# Patient Record
Sex: Female | Born: 1960
Health system: Southern US, Community
[De-identification: ages and names within clinical notes are randomized; demographics above are authoritative.]

## PROBLEM LIST (undated history)

## (undated) DIAGNOSIS — M069 Rheumatoid arthritis, unspecified: Secondary | ICD-10-CM

## (undated) DIAGNOSIS — N200 Calculus of kidney: Secondary | ICD-10-CM

## (undated) DIAGNOSIS — G5600 Carpal tunnel syndrome, unspecified upper limb: Secondary | ICD-10-CM

## (undated) HISTORY — DX: Carpal tunnel syndrome, unspecified upper limb: G56.00

## (undated) HISTORY — PX: DILATION AND CURETTAGE OF UTERUS: SHX78

## (undated) HISTORY — PX: HERNIA REPAIR: SHX51

## (undated) HISTORY — DX: Rheumatoid arthritis, unspecified: M06.9

## (undated) HISTORY — PX: UTERINE SEPTUM RESECTION: SHX5386

## (undated) HISTORY — DX: Calculus of kidney: N20.0

---

## 1997-06-23 ENCOUNTER — Other Ambulatory Visit: Admission: RE | Admit: 1997-06-23 | Discharge: 1997-06-23 | Payer: Self-pay | Admitting: Gynecology

## 1998-09-11 ENCOUNTER — Other Ambulatory Visit: Admission: RE | Admit: 1998-09-11 | Discharge: 1998-09-11 | Payer: Self-pay | Admitting: Gynecology

## 1999-07-21 ENCOUNTER — Ambulatory Visit (HOSPITAL_COMMUNITY): Admission: RE | Admit: 1999-07-21 | Discharge: 1999-07-21 | Payer: Self-pay | Admitting: Obstetrics and Gynecology

## 1999-07-21 ENCOUNTER — Encounter: Payer: Self-pay | Admitting: Obstetrics and Gynecology

## 1999-12-14 ENCOUNTER — Inpatient Hospital Stay (HOSPITAL_COMMUNITY): Admission: AD | Admit: 1999-12-14 | Discharge: 1999-12-14 | Payer: Self-pay | Admitting: Obstetrics and Gynecology

## 1999-12-25 ENCOUNTER — Inpatient Hospital Stay (HOSPITAL_COMMUNITY): Admission: AD | Admit: 1999-12-25 | Discharge: 1999-12-28 | Payer: Self-pay | Admitting: Obstetrics and Gynecology

## 2000-01-29 ENCOUNTER — Other Ambulatory Visit: Admission: RE | Admit: 2000-01-29 | Discharge: 2000-01-29 | Payer: Self-pay | Admitting: Obstetrics and Gynecology

## 2001-01-25 ENCOUNTER — Other Ambulatory Visit: Admission: RE | Admit: 2001-01-25 | Discharge: 2001-01-25 | Payer: Self-pay | Admitting: Gynecology

## 2002-01-08 ENCOUNTER — Other Ambulatory Visit: Admission: RE | Admit: 2002-01-08 | Discharge: 2002-01-08 | Payer: Self-pay | Admitting: Gynecology

## 2004-02-14 ENCOUNTER — Other Ambulatory Visit: Admission: RE | Admit: 2004-02-14 | Discharge: 2004-02-14 | Payer: Self-pay | Admitting: Gynecology

## 2008-05-02 ENCOUNTER — Other Ambulatory Visit: Admission: RE | Admit: 2008-05-02 | Discharge: 2008-05-02 | Payer: Self-pay | Admitting: Gynecology

## 2009-04-10 ENCOUNTER — Encounter: Admission: RE | Admit: 2009-04-10 | Discharge: 2009-04-10 | Payer: Self-pay | Admitting: Family Medicine

## 2010-09-18 NOTE — Discharge Summary (Signed)
[facility]  Patient:    Amber Sampson, Amber Sampson                      MRN: [id number] Adm. Date:  [id number] Disc. Date: [id number] Attending:  Osborn Coho Dictator:   Leilani Able, P.A.                           Discharge Summary  FINAL DIAGNOSES:              1. Spontaneous vaginal delivery of female infant                                  with Apgars of 5 and 8.                               2. Postpartum temperatures.  HISTORY OF PRESENT ILLNESS:   This 50 year old G10, P2-0-7-2 presents at 41-1/7 weeks in active labor.  The patients cervix was complete and complete at this time.  She did have thick, meconium stained fluid.  The patients prenatal course has been complicated advanced maternal age.  She did have a normal AFP, but declined amniocentesis.  The patient has a history of preterm labor that was treated with Terbutaline, as well.  The patient also has a history of positive group B Strep.  The patient was admitted at this time.  HOSPITAL COURSE:              Upon Dr. Linwood Dibbles arrival, the patient had been pushing for about 10 minutes.  She was noted to have thick, meconium stained fluid and variable decelerations.  She had episiotomy with delivery of a 9 pound, 9 ounce female infant with Apgars of 5 and 8.  There was a tight nuchal cord x 1 that was cleanly cut.  There was mild shoulder dystocia treated with McRoberts maneuver.  The baby was handed to the NICU.  The patient did have a postpartum hemorrhage that was treated with Pitocin, Hemabate and massage.  The mother did not get IV antibiotics prior to delivery secondary to rapid labor.  The patients postpartum course was complicated by some anemia and some low grade temperatures.  Her abdomen was soft and nontender and her bleeding had subsided.  On postpartum day #2, the patients temperature was about 100.2.  Some family members had been sick with viral syndrome and GI symptoms.   The patient was kept one more day to follow the temperatures.  On postpartum day #3, the patient still had a temperature of about 100.4.  She was complaining of cough with sputum production of about one week.  Her lungs were examined and noted to have some wheezes and rales, left greater than right.  Her abdomen was still soft and nontender.  The low grade temperatures were thought to probably be secondary to a respiratory infection.  The patient was felt ready for discharge.  DISPOSITION:                  The patient was sent home on a regular diet an told to decrease activities.  She was told to continue prenatal vitamins and FeSO4.  She was given Tylox, 1-2 q.4h. p.r.n. pain.  She was also sent home with Ceftin 250 mg, one b.i.d. x 7 days.  She was told to call for any temperatures above 101. DD:  01/18/00 TD:  01/19/00 Job: [id number]

## 2010-12-07 IMAGING — US US ABDOMEN COMPLETE
1 series · 14 of 25 positions shown · non-contrast
Comparison: None.

CLINICAL DATA: Possible ventral hernia.

COMPLETE ABDOMINAL ULTRASOUND

[Series 1: us abdomen complete · 14 of 93 slices shown]
[im 1/93]
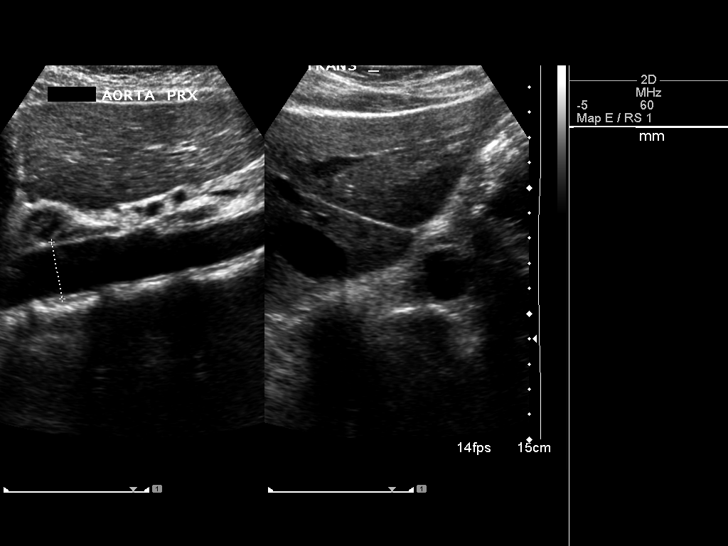
[im 8/93]
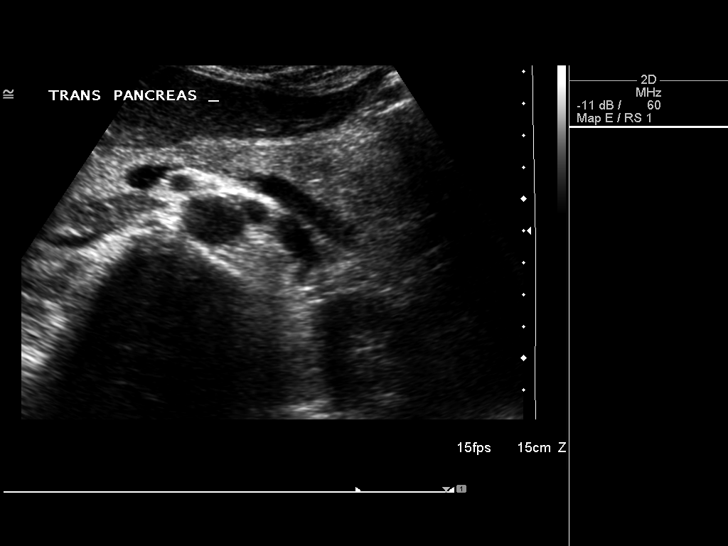
[im 16/93]
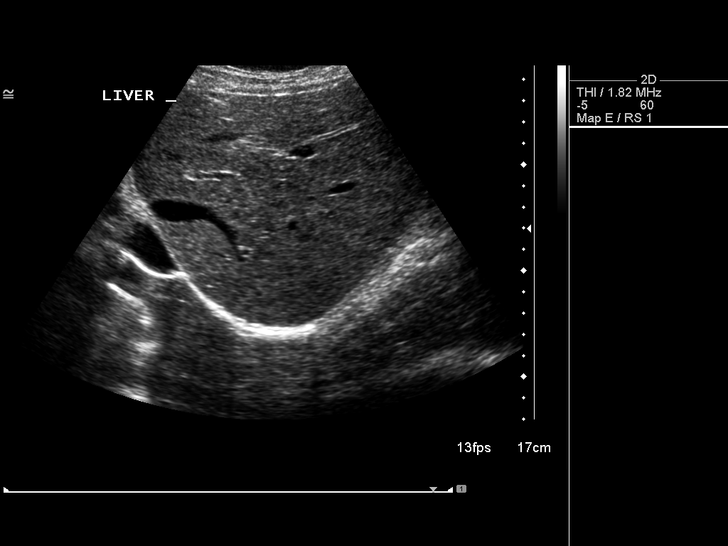
[im 24/93]
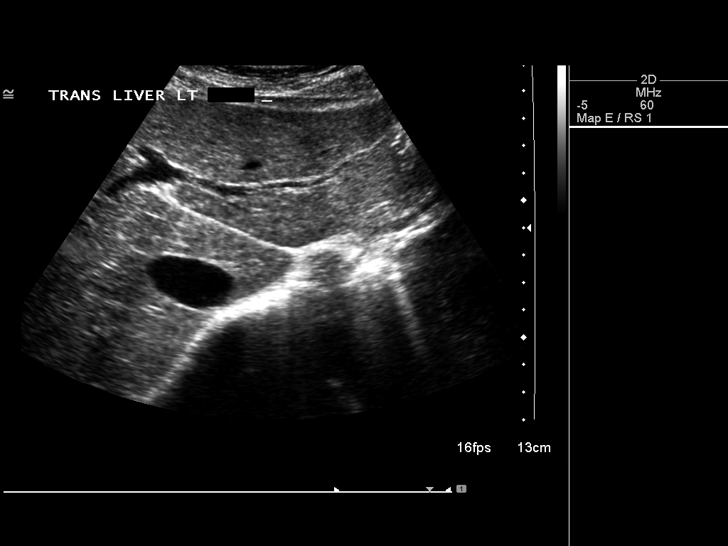
[im 31/93]
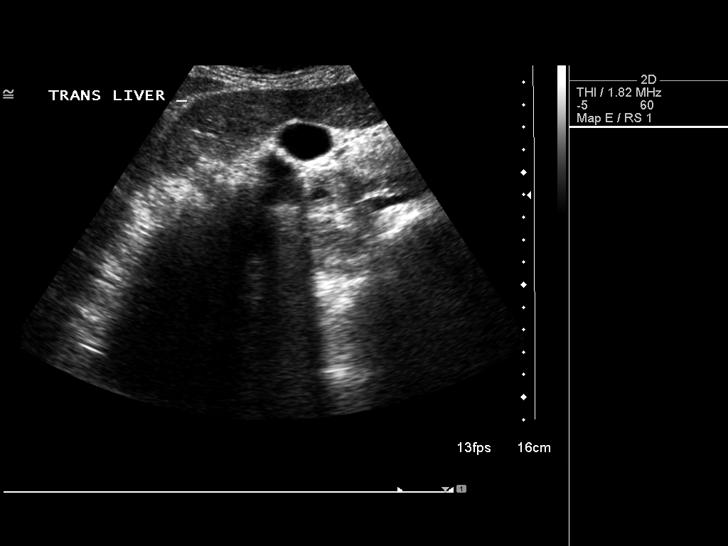
[im 35/93]
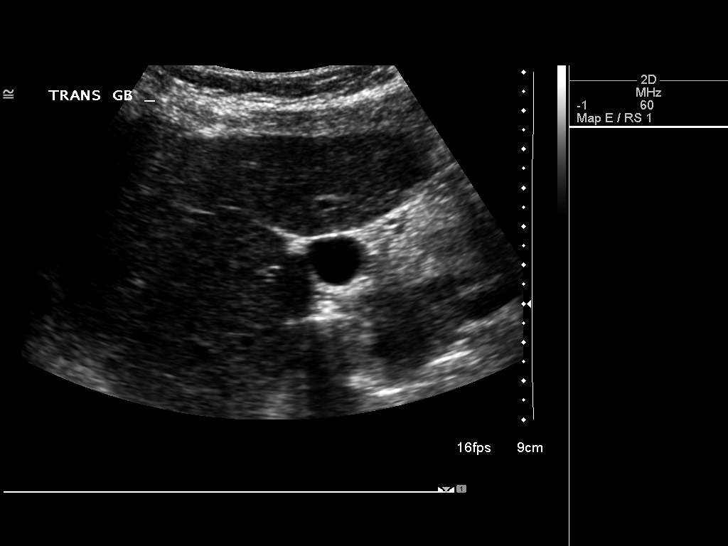
[im 43/93]
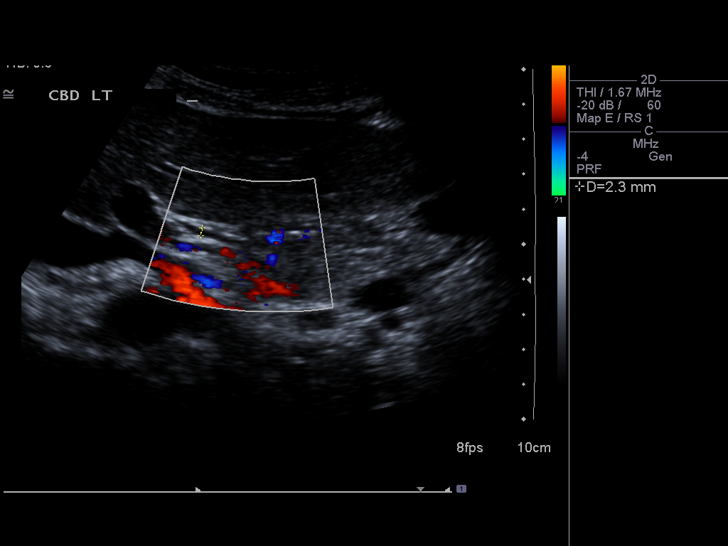
[im 50/93]
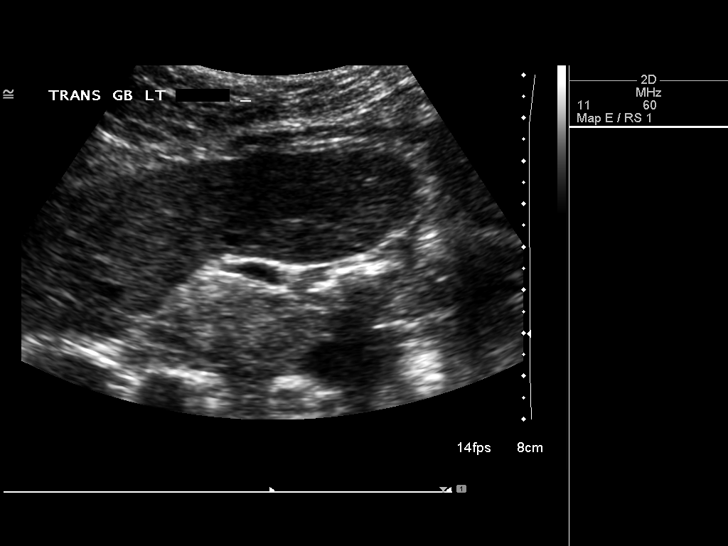
[im 58/93]
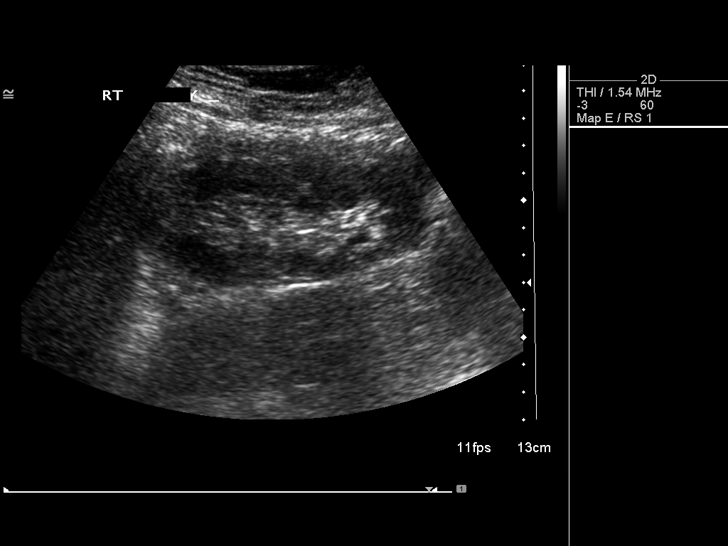
[im 62/93]
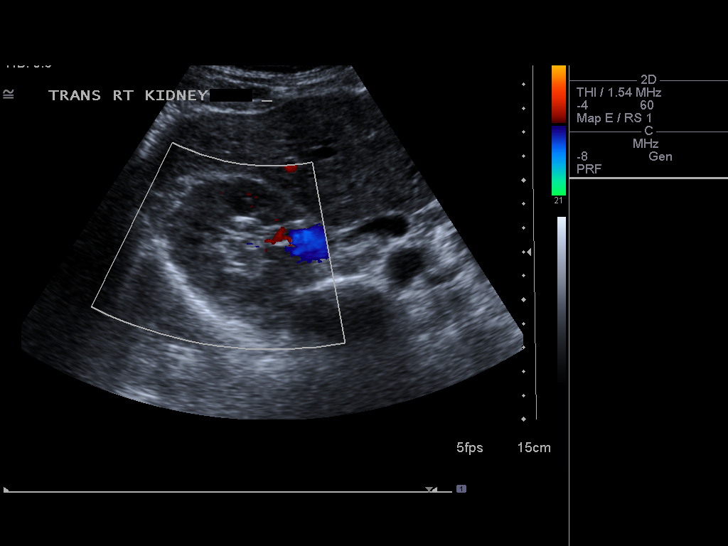
[im 70/93]
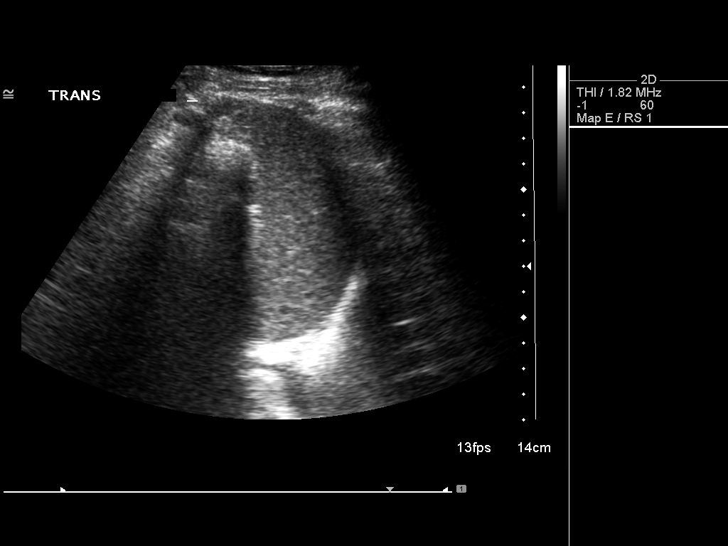
[im 77/93]
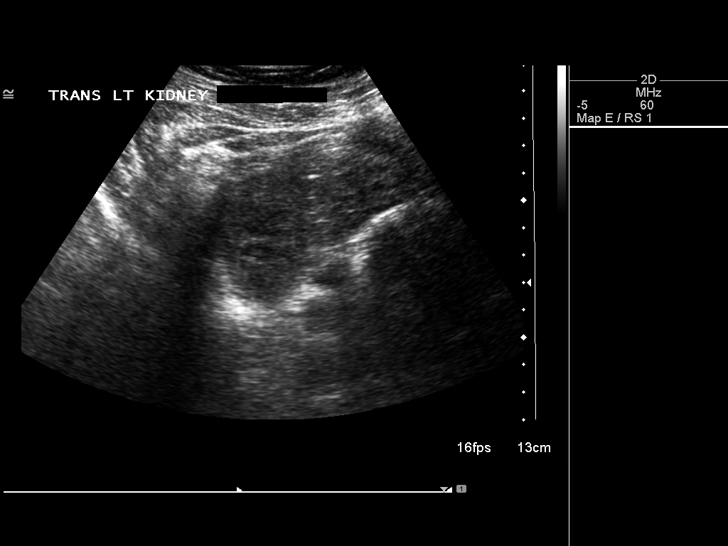
[im 85/93]
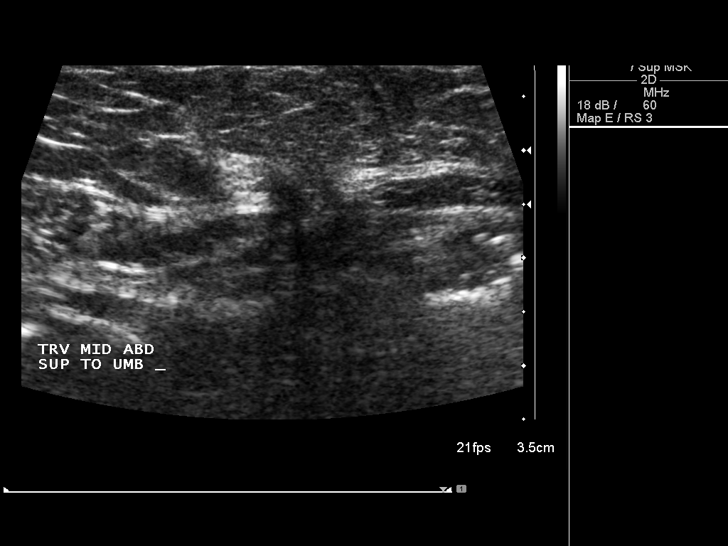
[im 93/93]
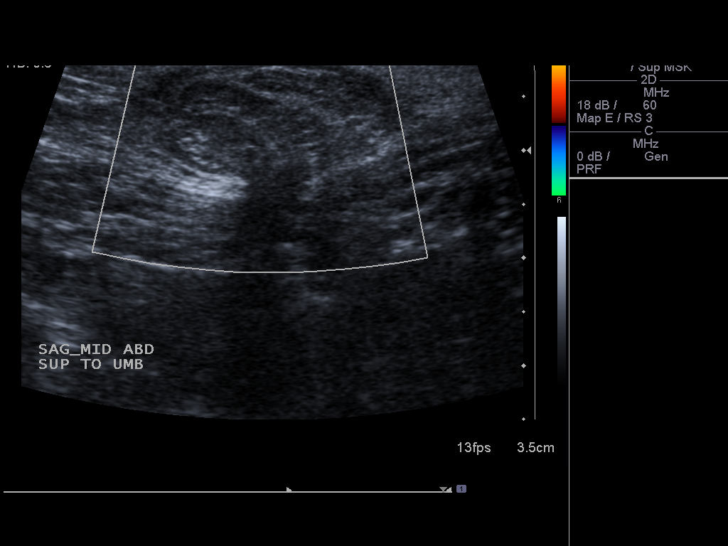

[14 of 25 positions shown; findings below may reference images not displayed]

FINDINGS: Gallbladder:  No gallstones, gallbladder wall thickening, or
pericholecystic fluid.

Common bile duct:  2.3 mm diameter, unremarkable

Liver:  No focal lesion identified.  Within normal limits in
parenchymal echogenicity. Portal vein dilated to 14 mm diameter.

IVC:  Appears normal.

Pancreas:  No focal abnormality seen.

Spleen:  5.9 cm in length, unremarkable

Right Kidney:    10.2 cm in length, without focal lesion or
hydronephrosis

Left Kidney:  10.8 cm in length, without focal lesion or
hydronephrosis

Abdominal aorta:  No aneurysm identified.

There is a small ventral hernia corresponding to the   palpable
region which contains mesenteric fat measuring approximately 12 x
23 mm superior to the umbilicus. No evidence of bowel, fluid, or
peristalsis within the lesion.
IMPRESSION: 1.  2.3 cm ventral hernia which appears to contain only mesenteric
fat.
2.  Otherwise negative   abdominal ultrasound.

## 2016-05-03 HISTORY — PX: CYSTOSCOPY: SUR368

## 2016-07-26 DIAGNOSIS — J029 Acute pharyngitis, unspecified: Secondary | ICD-10-CM | POA: Diagnosis not present

## 2016-07-26 DIAGNOSIS — R809 Proteinuria, unspecified: Secondary | ICD-10-CM | POA: Diagnosis not present

## 2016-07-26 DIAGNOSIS — R310 Gross hematuria: Secondary | ICD-10-CM | POA: Diagnosis not present

## 2016-07-26 DIAGNOSIS — R824 Acetonuria: Secondary | ICD-10-CM | POA: Diagnosis not present

## 2016-07-30 DIAGNOSIS — R310 Gross hematuria: Secondary | ICD-10-CM | POA: Diagnosis not present

## 2016-07-30 DIAGNOSIS — R1032 Left lower quadrant pain: Secondary | ICD-10-CM | POA: Diagnosis not present

## 2016-07-30 DIAGNOSIS — R002 Palpitations: Secondary | ICD-10-CM | POA: Diagnosis not present

## 2016-08-23 DIAGNOSIS — R310 Gross hematuria: Secondary | ICD-10-CM | POA: Diagnosis not present

## 2016-08-23 DIAGNOSIS — R350 Frequency of micturition: Secondary | ICD-10-CM | POA: Diagnosis not present

## 2016-08-31 DIAGNOSIS — R310 Gross hematuria: Secondary | ICD-10-CM | POA: Diagnosis not present

## 2016-08-31 DIAGNOSIS — R3914 Feeling of incomplete bladder emptying: Secondary | ICD-10-CM | POA: Diagnosis not present

## 2016-09-03 ENCOUNTER — Encounter (HOSPITAL_COMMUNITY): Payer: Self-pay | Admitting: Emergency Medicine

## 2016-09-03 ENCOUNTER — Emergency Department (HOSPITAL_COMMUNITY)
Admission: EM | Admit: 2016-09-03 | Discharge: 2016-09-03 | Disposition: A | Discharge status: Home / Self Care | Payer: 59 | Attending: Emergency Medicine | Admitting: Emergency Medicine

## 2016-09-03 DIAGNOSIS — Z79899 Other long term (current) drug therapy: Secondary | ICD-10-CM | POA: Diagnosis not present

## 2016-09-03 DIAGNOSIS — F17200 Nicotine dependence, unspecified, uncomplicated: Secondary | ICD-10-CM | POA: Diagnosis not present

## 2016-09-03 DIAGNOSIS — R300 Dysuria: Secondary | ICD-10-CM

## 2016-09-03 LAB — URINALYSIS, ROUTINE W REFLEX MICROSCOPIC
Bilirubin Urine: NEGATIVE
Glucose, UA: NEGATIVE mg/dL
Ketones, ur: NEGATIVE mg/dL
Leukocytes, UA: NEGATIVE
Nitrite: NEGATIVE
Protein, ur: NEGATIVE mg/dL
Specific Gravity, Urine: 1.006 (ref 1.005–1.030)
pH: 6 (ref 5.0–8.0)

## 2016-09-03 LAB — I-STAT CHEM 8, ED
BUN: 16 mg/dL (ref 6–20)
Calcium, Ion: 1.24 mmol/L (ref 1.15–1.40)
Chloride: 110 mmol/L (ref 101–111)
Creatinine, Ser: 1 mg/dL (ref 0.44–1.00)
Glucose, Bld: 119 mg/dL — ABNORMAL HIGH (ref 65–99)
HCT: 43 % (ref 36.0–46.0)
Hemoglobin: 14.6 g/dL (ref 12.0–15.0)
Potassium: 3.8 mmol/L (ref 3.5–5.1)
Sodium: 144 mmol/L (ref 135–145)
TCO2: 25 mmol/L (ref 0–100)

## 2016-09-03 MED ORDER — OXYBUTYNIN CHLORIDE ER 5 MG PO TB24: 5.0000 mg | ORAL_TABLET | Freq: Every day | ORAL | 0 refills | Status: DC

## 2016-09-03 MED ORDER — PHENAZOPYRIDINE HCL 200 MG PO TABS: 200.0000 mg | ORAL_TABLET | Freq: Three times a day (TID) | ORAL | 0 refills | Status: DC

## 2016-09-03 MED ORDER — PHENAZOPYRIDINE HCL 200 MG PO TABS
200.0000 mg | ORAL_TABLET | Freq: Once | ORAL | Status: AC
  Administered 2016-09-03: 200 mg via ORAL
  Filled 2016-09-03: qty 1

## 2016-09-03 NOTE — ED Notes (Signed)
Pt awaiting provider.

## 2016-09-03 NOTE — ED Notes (Signed)
Pt  States that she has not urinated but small dribbles, pt states that she has pressure, urinary frequency, and sharp pains to RUQ and LUQ. PT was recently seen by Urology on 08/28/16 for hematuria, and was placed on Rapaflo in which patient has been taking for  The past 3 nights. Pt was bladder scanned and showed approx. 180mls of urine retention. Pt denies nausea, vomiting and diarrhea, pain with urination. Pt was schedule for cystoscopy next week.

## 2016-09-03 NOTE — ED Provider Notes (Signed)
[facility] DEPT Provider Note   CSN: [id number] Arrival date & time: 09/03/16  0404     History   Chief Complaint Chief Complaint  Patient presents with  . Urinary Retention    HPI Amber Sampson is a 56 y.o. female.  Patient is a 56 year old female with no significant past medical history except for tobacco use presenting today with urinary frequency, urgency and dysuria. Patient states approximately 4 weeks ago she started having gross hematuria in her urine intermittently. She was seen by urology 2 weeks ago with that time a contrasted CT and cystoscopy were set up. She is supposed to get those next Thursday. Approximately 6 days ago patient started developing urgency, frequency and dysuria as well as the feeling that she is not emptying her bladder. She was seen back a urology on Monday or Tuesday of this week and at that time a urine was done without evidence of infection and she was started on Rapaflo. She is been taking that medication as prescribed but her symptoms are only worsening. She is unable to work because she is going to the bathroom sometimes over 30 times a day and only able to get out dribbling. She has an intense discomfort in her suprapubic area that comes and goes but denies any nausea, vomiting or fevers. The gross hematuria has continued to be intermittent and she has not had any in the last few days.  She denies any flank pain. Patient does note her mother had interstitial cystitis that developed in her 3s.   The history is provided by the patient.    History reviewed. No pertinent past medical history.  There are no active problems to display for this patient.   Past Surgical History:  Procedure Laterality Date  . CESAREAN SECTION    . HERNIA REPAIR      OB History    No data available       Home Medications    Prior to Admission medications   Medication Sig Start Date End Date Taking? Authorizing Provider  albuterol (PROVENTIL HFA;VENTOLIN  HFA) 108 (90 Base) MCG/ACT inhaler Inhale 1 puff into the lungs every 6 (six) hours as needed for wheezing or shortness of breath.   Yes Historical Provider, MD  silodosin (RAPAFLO) 8 MG CAPS capsule Take 8 mg by mouth daily with breakfast.   Yes Historical Provider, MD    Family History History reviewed. No pertinent family history.  Social History Social History  Substance Use Topics  . Smoking status: Light Tobacco Smoker  . Smokeless tobacco: Never Used  . Alcohol use No     Allergies   Levaquin [levofloxacin in d5w] and Sulfa antibiotics   Review of Systems Review of Systems  All other systems reviewed and are negative.    Physical Exam Updated Vital Signs BP (!) 152/83   Pulse 88   Temp 97.6 F (36.4 C) (Oral)   Resp 16   Ht 5\' 7"  (1.702 m)   Wt 190 lb (86.2 kg)   SpO2 97%   BMI 29.76 kg/m   Physical Exam  Constitutional: She is oriented to person, place, and time. She appears well-developed and well-nourished. No distress.  HENT:  Head: Normocephalic and atraumatic.  Mouth/Throat: Oropharynx is clear and moist.  Eyes: Conjunctivae and EOM are normal. Pupils are equal, round, and reactive to light.  Neck: Normal range of motion. Neck supple.  Cardiovascular: Normal rate, regular rhythm and intact distal pulses.   No murmur heard. Pulmonary/Chest: Effort  normal and breath sounds normal. No respiratory distress. She has no wheezes. She has no rales.  Abdominal: Soft. She exhibits no distension. There is tenderness in the suprapubic area. There is no rebound and no guarding.  Mild without guarding  Musculoskeletal: Normal range of motion. She exhibits no edema or tenderness.  Neurological: She is alert and oriented to person, place, and time.  Skin: Skin is warm and dry. No rash noted. No erythema.  Psychiatric: She has a normal mood and affect. Her behavior is normal.  Nursing note and vitals reviewed.    ED Treatments / Results  Labs (all labs ordered  are listed, but only abnormal results are displayed) Labs Reviewed  URINALYSIS, ROUTINE W REFLEX MICROSCOPIC - Abnormal; Notable for the following:       Result Value   Color, Urine STRAW (*)    Hgb urine dipstick SMALL (*)    Bacteria, UA RARE (*)    Squamous Epithelial / LPF 0-5 (*)    All other components within normal limits  I-STAT CHEM 8, ED - Abnormal; Notable for the following:    Glucose, Bld 119 (*)    All other components within normal limits    EKG  EKG Interpretation None       Radiology No results found.  Procedures Procedures (including critical care time)  Medications Ordered in ED Medications  phenazopyridine (PYRIDIUM) tablet 200 mg (200 mg Oral Given 09/03/16 1194)     Initial Impression / Assessment and Plan / ED Course  I have reviewed the triage vital signs and the nursing notes.  Pertinent labs & imaging results that were available during my care of the patient were reviewed by me and considered in my medical decision making (see chart for details).    Patient presenting with worsening dysuria, frequency, urgency and the feeling that she is not completely emptying her bladder. She denies any symptoms concerning for pyelonephritis. She is afebrile with normal vital signs. UA with a small amount of hemoglobin but no evidence of white cells or bacteria concerning for UTI. After urinating repeat ultrasound showed an empty bladder. I-STAT within normal limits. Patient will be given Pyridium and Ditropan. To see if that gives symptomatic relief. Also encouraged her to call urology today  Final Clinical Impressions(s) / ED Diagnoses   Final diagnoses:  Dysuria    New Prescriptions New Prescriptions   OXYBUTYNIN (DITROPAN XL) 5 MG 24 HR TABLET    Take 1 tablet (5 mg total) by mouth at bedtime.   PHENAZOPYRIDINE (PYRIDIUM) 200 MG TABLET    Take 1 tablet (200 mg total) by mouth 3 (three) times daily.     Blanchie Dessert, MD 09/03/16 [phone]

## 2016-09-03 NOTE — ED Notes (Signed)
Provider at bedside

## 2016-09-03 NOTE — ED Notes (Signed)
PT made aware urine specimen is needed.

## 2016-09-09 DIAGNOSIS — R310 Gross hematuria: Secondary | ICD-10-CM | POA: Diagnosis not present

## 2016-09-09 DIAGNOSIS — N200 Calculus of kidney: Secondary | ICD-10-CM | POA: Diagnosis not present

## 2016-09-09 DIAGNOSIS — R350 Frequency of micturition: Secondary | ICD-10-CM | POA: Diagnosis not present

## 2016-09-28 DIAGNOSIS — N201 Calculus of ureter: Secondary | ICD-10-CM | POA: Diagnosis not present

## 2016-12-14 DIAGNOSIS — D485 Neoplasm of uncertain behavior of skin: Secondary | ICD-10-CM | POA: Diagnosis not present

## 2016-12-14 DIAGNOSIS — L080 Pyoderma: Secondary | ICD-10-CM | POA: Diagnosis not present

## 2017-02-16 DIAGNOSIS — Z23 Encounter for immunization: Secondary | ICD-10-CM | POA: Diagnosis not present

## 2017-03-16 DIAGNOSIS — L905 Scar conditions and fibrosis of skin: Secondary | ICD-10-CM | POA: Diagnosis not present

## 2017-03-30 DIAGNOSIS — M25532 Pain in left wrist: Secondary | ICD-10-CM | POA: Diagnosis not present

## 2017-07-07 DIAGNOSIS — M65839 Other synovitis and tenosynovitis, unspecified forearm: Secondary | ICD-10-CM | POA: Diagnosis not present

## 2017-07-07 DIAGNOSIS — M79642 Pain in left hand: Secondary | ICD-10-CM | POA: Diagnosis not present

## 2017-07-07 DIAGNOSIS — M79641 Pain in right hand: Secondary | ICD-10-CM | POA: Diagnosis not present

## 2017-07-13 DIAGNOSIS — M79641 Pain in right hand: Secondary | ICD-10-CM | POA: Diagnosis not present

## 2017-07-22 DIAGNOSIS — M79641 Pain in right hand: Secondary | ICD-10-CM | POA: Diagnosis not present

## 2017-07-22 DIAGNOSIS — M65839 Other synovitis and tenosynovitis, unspecified forearm: Secondary | ICD-10-CM | POA: Diagnosis not present

## 2017-07-22 DIAGNOSIS — G5601 Carpal tunnel syndrome, right upper limb: Secondary | ICD-10-CM | POA: Diagnosis not present

## 2017-07-28 DIAGNOSIS — M65831 Other synovitis and tenosynovitis, right forearm: Secondary | ICD-10-CM | POA: Diagnosis not present

## 2017-08-01 DIAGNOSIS — G5601 Carpal tunnel syndrome, right upper limb: Secondary | ICD-10-CM | POA: Diagnosis not present

## 2017-08-11 DIAGNOSIS — G5601 Carpal tunnel syndrome, right upper limb: Secondary | ICD-10-CM | POA: Diagnosis not present

## 2017-08-11 DIAGNOSIS — M65831 Other synovitis and tenosynovitis, right forearm: Secondary | ICD-10-CM | POA: Diagnosis not present

## 2017-09-22 NOTE — Progress Notes (Signed)
Office Visit Note  Patient: Amber Sampson             Date of Birth: 11/17/1960           MRN: [id number]             PCP: Mayra Neer, MD Referring: Roseanne Kaufman, MD Visit Date: 10/03/2017 Occupation: Benefits authorization at [employer]    Subjective:  Swelling in bilateral hands  History of Present Illness: Amber Sampson is a 57 y.o. female seen in consultation per request of Dr. Amedeo Plenty.  According to patient in November 2018 she woke up with left hand swelling and redness.  She states her wrist and fingers were swollen and she was not able to make a fist.  The next day she was seen by Dr. Delilah Shan who gave her 6-day prednisone taper and the swelling resolved.  She was asymptomatic until April 2019 when she woke up with right hand wrist joint swelling and was unable to make a fist.  She states she was not able to move her wrist joint and by the evening she was having tingling and numbness in her right hand.  The next morning she was seen by Dr. Amedeo Plenty who gave her carpal tunnel syndrome injection and gave her a splint.  She was also given indomethacin.  As her symptoms persist she had nerve conduction velocity which was consistent with carpal tunnel syndrome and MRI of her wrist joint which was consistent with tenosynovitis.  He also obtain some blood work.  She has been off indomethacin for the last 2 months.  She continues to have some intermittent paresthesias which she describes in her first, third and fourth finger.  None of the other joints are painful.  Activities of Daily Living:  Patient reports morning stiffness for 0 minutes.   Patient Reports nocturnal pain.  Difficulty dressing/grooming: Denies Difficulty climbing stairs: Denies Difficulty getting out of chair: Denies Difficulty using hands for taps, buttons, cutlery, and/or writing: Denies   Review of Systems  Constitutional: Negative for fatigue.  HENT: Negative for mouth sores, mouth dryness and nose  dryness.   Eyes: Negative for pain, visual disturbance and dryness.  Respiratory: Negative for cough, hemoptysis, shortness of breath and difficulty breathing.   Cardiovascular: Negative for chest pain, palpitations, hypertension and swelling in legs/feet.  Gastrointestinal: Negative for blood in stool, constipation and diarrhea.  Endocrine: Negative for increased urination.  Genitourinary: Negative for painful urination.  Musculoskeletal: Positive for arthralgias and joint pain. Negative for joint swelling, myalgias, muscle weakness, morning stiffness, muscle tenderness and myalgias.  Skin: Negative for color change, pallor, rash, hair loss, nodules/bumps, skin tightness, ulcers and sensitivity to sunlight.  Allergic/Immunologic: Negative for susceptible to infections.  Neurological: Positive for parasthesias (Right hand). Negative for dizziness, numbness, headaches and weakness.  Hematological: Negative for swollen glands.  Psychiatric/Behavioral: Negative for depressed mood and sleep disturbance. The patient is not nervous/anxious.     PMFS History:  Patient Active Problem List   Diagnosis Date Noted  . History of kidney stones 10/03/2017    History reviewed. No pertinent past medical history.  Family History  Problem Relation Age of Onset  . Heart Problems Mother        pacemaker   . Arthritis Father   . Healthy Sister   . Healthy Daughter   . Healthy Daughter   . Healthy Son    Past Surgical History:  Procedure Laterality Date  . CESAREAN SECTION    .  CYSTOSCOPY  2018  . DILATION AND CURETTAGE OF UTERUS     x6  . HERNIA REPAIR    . UTERINE SEPTUM RESECTION  1990s   Social History   Social History Narrative  . Not on file     Objective: Vital Signs: BP (!) 133/93 (BP Location: Left Arm, Patient Position: Sitting, Cuff Size: Normal)   Pulse 84   Resp 15   Ht 5' 6.73" (1.695 m)   Wt 176 lb (79.8 kg)   BMI 27.79 kg/m    Physical Exam  Constitutional: She is  oriented to person, place, and time. She appears well-developed and well-nourished.  HENT:  Head: Normocephalic and atraumatic.  Eyes: Conjunctivae and EOM are normal.  Neck: Normal range of motion.  Cardiovascular: Normal rate, regular rhythm, normal heart sounds and intact distal pulses.  Pulmonary/Chest: Effort normal and breath sounds normal.  Abdominal: Soft. Bowel sounds are normal.  Lymphadenopathy:    She has no cervical adenopathy.  Neurological: She is alert and oriented to person, place, and time.  Skin: Skin is warm and dry. Capillary refill takes less than 2 seconds.  Psychiatric: She has a normal mood and affect. Her behavior is normal.  Nursing note and vitals reviewed.    Musculoskeletal Exam: C-spine thoracic lumbar spine good range of motion.  Shoulder joints elbow joints wrist joint MCPs PIPs DIPs were in good range of motion.  Hip joints knee joints ankles MTPs PIPs been good range of motion with no synovitis.  CDAI Exam: No CDAI exam completed.    Investigation: Findings:  07/22/17: B12 310, TSH 0.28, T3 2.3, T4 1.5, CRP 0.2, RF 16, ANA -, sed rate 2, uric acid 5.2, CMP WNL, CBC stable  MRI on 07/28/17 of the right wrist revealed minimal extensor carpi radialis tenosynovitis otherwise study unremarkable  EMG and NCV on 08/01/17: Evaluation of the right median motor nerve showed prolonged distal onset latency.  Right median sensory in the right ulnar sensory nerves showed prolonged distal peak latency.  July 07, 2017 x-ray of the bilateral hands showed minimal PIP narrowing no MCP or intercarpal joint space narrowing was noted.  No erosive changes were noted.  CBC Latest Ref Rng & Units 09/03/2016  Hemoglobin 12.0 - 15.0 g/dL 14.6  Hematocrit 36.0 - 46.0 % 43.0   CMP Latest Ref Rng & Units 09/03/2016  Glucose 65 - 99 mg/dL 119(H)  BUN 6 - 20 mg/dL 16  Creatinine 0.44 - 1.00 mg/dL 1.00  Sodium 135 - 145 mmol/L 144  Potassium 3.5 - 5.1 mmol/L 3.8  Chloride 101 - 111  mmol/L 110     Imaging: No results found.  Speciality Comments: No specialty comments available.    Procedures:  No procedures performed Allergies: Levaquin [levofloxacin in d5w] and Sulfa antibiotics   Assessment / Plan:     Visit Diagnoses:   Pain in both hands-patient had 2 episodes of wrist and hand swelling initial in her left hand and then later a few months later in her right hand.  She states that pain and swelling was sudden onset and severe which resolved after taking prednisone or anti-inflammatories.  Her labs were unremarkable except for mildly elevated rheumatoid factor.  The MRI showed extensor tenosynovitis.  Currently she is not very symptomatic.  There is positive family history of rheumatoid arthritis in her father.  It is possible that she may be getting intermittent flares which could be early or rheumatoid arthritis.  While she had her uric acid  levels drawn she was having a flare I would like to repeat her uric acid levels today as well.  As she is an asymptomatic currently I did not initiate any treatment.  Natural anti-inflammatories were discussed.  She is supposed to notify me if she develops any new symptoms.    Carpal tunnel syndrome of right wrist - Injected by Dr. GramigEMG/NCV 08/01/17 moderate carpal tunnel MRI 07/28/17: minimal extensor carpi radialis tenosynovitis.  Patient complains of intermittent paresthesias in her right hand.  Tenosynovitis of right wrist - 07/22/17: B12 310, TSH 0.28, CRP 0.2, RF 16, ANA -, sed rate 2, uric acid 5.2Prednisone taper started 4/2/19Indomethacin ER 75 mg daily which she stopped about 2 months ago.- Plan: Cyclic citrul peptide antibody, IgG, 14-3-3 eta Protein, Uric acid, ANA  Rheumatoid factor positive - 07/22/17: RF 16   History of kidney stones-she states the stones were analyzed but she does not recall the results.  Family history of rheumatoid arthritis - Her father was treated with methotrexate.    Orders: Orders  Placed This Encounter  Procedures  . Cyclic citrul peptide antibody, IgG  . 14-3-3 eta Protein  . Uric acid  . ANA   No orders of the defined types were placed in this encounter.   Face-to-face time spent with patient was 45 minutes. >50% of time was spent in counseling and coordination of care.  Follow-Up Instructions: No follow-ups on file.   Bo Merino, MD  Note - This record has been created using Editor, commissioning.  Chart creation errors have been sought, but may not always  have been located. Such creation errors do not reflect on  the standard of medical care

## 2017-10-03 ENCOUNTER — Encounter: Payer: Self-pay | Admitting: Rheumatology

## 2017-10-03 ENCOUNTER — Ambulatory Visit (INDEPENDENT_AMBULATORY_CARE_PROVIDER_SITE_OTHER): Payer: 59 | Admitting: Rheumatology

## 2017-10-03 VITALS — BP 133/93 | HR 84 | Resp 15 | Ht 66.73 in | Wt 176.0 lb

## 2017-10-03 DIAGNOSIS — Z8261 Family history of arthritis: Secondary | ICD-10-CM

## 2017-10-03 DIAGNOSIS — Z87442 Personal history of urinary calculi: Secondary | ICD-10-CM | POA: Diagnosis not present

## 2017-10-03 DIAGNOSIS — M659 Synovitis and tenosynovitis, unspecified: Secondary | ICD-10-CM

## 2017-10-03 DIAGNOSIS — R768 Other specified abnormal immunological findings in serum: Secondary | ICD-10-CM | POA: Diagnosis not present

## 2017-10-03 DIAGNOSIS — M79641 Pain in right hand: Secondary | ICD-10-CM

## 2017-10-03 DIAGNOSIS — G5601 Carpal tunnel syndrome, right upper limb: Secondary | ICD-10-CM

## 2017-10-03 DIAGNOSIS — M79642 Pain in left hand: Secondary | ICD-10-CM | POA: Diagnosis not present

## 2017-10-04 NOTE — Progress Notes (Signed)
Labs are consistent with rheumatoid arthritis.  I plan to start her on Plaquenil at follow-up visit.

## 2017-10-06 LAB — CYCLIC CITRUL PEPTIDE ANTIBODY, IGG: Cyclic Citrullin Peptide Ab: 250 UNITS — ABNORMAL HIGH

## 2017-10-06 LAB — URIC ACID: Uric Acid, Serum: 4.3 mg/dL (ref 2.5–7.0)

## 2017-10-06 LAB — ANA: ANA: NEGATIVE

## 2017-10-06 LAB — 14-3-3 ETA PROTEIN: 14-3-3 eta Protein: <0.2 ng/mL (ref <0.2)

## 2017-10-15 DIAGNOSIS — G5601 Carpal tunnel syndrome, right upper limb: Secondary | ICD-10-CM | POA: Diagnosis not present

## 2017-10-24 ENCOUNTER — Encounter: Payer: Self-pay | Admitting: Rheumatology

## 2017-10-24 DIAGNOSIS — G5601 Carpal tunnel syndrome, right upper limb: Secondary | ICD-10-CM | POA: Diagnosis not present

## 2017-10-24 DIAGNOSIS — M65831 Other synovitis and tenosynovitis, right forearm: Secondary | ICD-10-CM | POA: Diagnosis not present

## 2017-10-24 DIAGNOSIS — M79641 Pain in right hand: Secondary | ICD-10-CM | POA: Diagnosis not present

## 2017-11-09 DIAGNOSIS — M0579 Rheumatoid arthritis with rheumatoid factor of multiple sites without organ or systems involvement: Secondary | ICD-10-CM | POA: Insufficient documentation

## 2017-11-09 NOTE — Progress Notes (Signed)
Office Visit Note  Patient: Amber Sampson             Date of Birth: 1960/08/30           MRN: [id number]             PCP: Mayra Neer, MD Referring: No ref. provider found Visit Date: 11/22/2017 Occupation: @GUAROCC @  Subjective:  Pain in both hands.   History of Present Illness: Amber Sampson is a 57 y.o. female history of seropositive rheumatoid arthritis.  She states she has been having increased pain and tingling in her bilateral hands.  She is having difficulty making a fist.  Her grip strength is decreased.  She has swelling in her hands when she has burning sensation.  No other joints are painful.  Activities of Daily Living:  Patient reports morning stiffness for 0 minutes.   Patient Denies nocturnal pain.  Difficulty dressing/grooming: Denies Difficulty climbing stairs: Denies Difficulty getting out of chair: Denies Difficulty using hands for taps, buttons, cutlery, and/or writing: Denies  Review of Systems  Constitutional: Negative for fatigue, night sweats, weight gain and weight loss.  HENT: Negative for mouth sores, trouble swallowing, trouble swallowing, mouth dryness and nose dryness.   Eyes: Negative for pain, redness, visual disturbance and dryness.  Respiratory: Negative for cough, shortness of breath and difficulty breathing.   Cardiovascular: Negative for chest pain, palpitations, hypertension, irregular heartbeat and swelling in legs/feet.  Gastrointestinal: Negative for abdominal pain, blood in stool, constipation and diarrhea.  Endocrine: Negative for increased urination.  Genitourinary: Negative for pelvic pain and vaginal dryness.  Musculoskeletal: Positive for arthralgias, joint pain and joint swelling. Negative for myalgias, muscle weakness, morning stiffness, muscle tenderness and myalgias.  Skin: Negative for color change, rash, hair loss, skin tightness, ulcers and sensitivity to sunlight.  Allergic/Immunologic: Negative for susceptible to  infections.  Neurological: Positive for numbness. Negative for dizziness, headaches, memory loss, night sweats and weakness.  Hematological: Positive for bruising/bleeding tendency. Negative for swollen glands.  Psychiatric/Behavioral: Negative for depressed mood, confusion and sleep disturbance. The patient is not nervous/anxious.     PMFS History:  Patient Active Problem List   Diagnosis Date Noted  . Rheumatoid arthritis involving multiple sites with positive rheumatoid factor ([place]) 11/09/2017  . History of kidney stones 10/03/2017  . Family history of rheumatoid arthritis 10/03/2017    History reviewed. No pertinent past medical history.  Family History  Problem Relation Age of Onset  . Heart Problems Mother        pacemaker   . Arthritis Father   . Healthy Sister   . Healthy Daughter   . Healthy Daughter   . Healthy Son    Past Surgical History:  Procedure Laterality Date  . CESAREAN SECTION    . CYSTOSCOPY  2018  . DILATION AND CURETTAGE OF UTERUS     x6  . HERNIA REPAIR    . UTERINE SEPTUM RESECTION  1990s   Social History   Social History Narrative  . Not on file    Objective: Vital Signs: BP (!) 153/96 (BP Location: Left Arm, Patient Position: Sitting, Cuff Size: Normal)   Pulse 81   Resp 12   Ht 5' 6.73" (1.695 m)   Wt 174 lb 8 oz (79.2 kg)   BMI 27.55 kg/m    Physical Exam  Constitutional: She is oriented to person, place, and time. She appears well-developed and well-nourished.  HENT:  Head: Normocephalic and atraumatic.  Eyes: Conjunctivae and EOM  are normal.  Neck: Normal range of motion.  Cardiovascular: Normal rate, regular rhythm, normal heart sounds and intact distal pulses.  Pulmonary/Chest: Effort normal and breath sounds normal.  Abdominal: Soft. Bowel sounds are normal.  Lymphadenopathy:    She has no cervical adenopathy.  Neurological: She is alert and oriented to person, place, and time.  Skin: Skin is warm and dry. Capillary refill  takes less than 2 seconds.  Psychiatric: She has a normal mood and affect. Her behavior is normal.  Nursing note and vitals reviewed.    Musculoskeletal Exam: C-spine thoracic lumbar spine good range of motion.  Shoulder joints elbow joints wrist joint MCPs PIPs DIPs been good range of motion.  She is some tenderness across MCPs but no synovitis was noted.  Hip joints knee joints ankles MTPs PIPs been good range of motion.  No synovitis was noted.  CDAI Exam: CDAI Homunculus Exam:   Joint Counts:  CDAI Tender Joint count: 0 CDAI Swollen Joint count: 0  Global Assessments:  Patient Global Assessment: 5 Provider Global Assessment: 5  CDAI Calculated Score: 10   Investigation: Findings:  Findings:  07/22/17: B12 310, TSH 0.28, T3 2.3, T4 1.5, CRP 0.2, RF 16, ANA -, sed rate 2, uric acid 5.2, CMP WNL, CBC stable  MRI on 07/28/17 of the right wrist revealed minimal extensor carpi radialis tenosynovitis otherwise study unremarkable  EMG and NCV on 08/01/17: Evaluation of the right median motor nerve showed prolonged distal onset latency.  Right median sensory in the right ulnar sensory nerves showed prolonged distal peak latency.  July 07, 2017 x-ray of the bilateral hands showed minimal PIP narrowing no MCP or intercarpal joint space narrowing was noted.  No erosive changes were noted.    October 03, 2017 anti-CCP 250, 14 3 3  at a negative, uric acid 4.3, ANA negative Imaging: No results found.  Recent Labs: Lab Results  Component Value Date   HGB 14.6 09/03/2016   NA 144 09/03/2016   K 3.8 09/03/2016   CL 110 09/03/2016   GLUCOSE 119 (H) 09/03/2016   BUN 16 09/03/2016   CREATININE 1.00 09/03/2016    Speciality Comments: No specialty comments available.  Procedures:  No procedures performed Allergies: Levaquin [levofloxacin in d5w] and Sulfa antibiotics   Assessment / Plan:     Visit Diagnoses: Rheumatoid arthritis involving multiple sites with positive rheumatoid factor  (HCC) - RF positive, anti-CCP positive.  Patient gives history of intermittent severe swelling to the point she has to take prednisone and have difficulty making a fist.  She has been also having flares of carpal tunnel syndrome with the joint swelling.  None of the other joints have been swelling besides her hands.  We had detailed discussion regarding different treatment options and their side effects.  Indications side effects contraindications of Plaquenil were discussed.  She had been allergic to sulfa in the past where she had mild rash and itching.  She will try Plaquenil at lower dose and increase it to as tolerated over the next couple of days.  She will be starting at Plaquenil 200 mg p.o. twice daily.  She has been advised to get pneumococcal and Shingrix vaccine.  We will check labs in a month along with G6PD.  We will check labs in a month then every 3 months to monitor for drug toxicity.  She is been advised to get a baseline eye examination and then she will need yearly eye examination to monitor for ocular toxicity.  Pain in both hands - MRI consistent with extensor carpi radialis tenosynovitis  High risk medication use-patient will be started on Plaquenil.  Carpal tunnel syndrome of right wrist - Status post injection by Dr. Amedeo Plenty August 01, 2017  Family history of rheumatoid arthritis  History of kidney stones   Orders: No orders of the defined types were placed in this encounter.  No orders of the defined types were placed in this encounter.   Face-to-face time spent with patient was 30 minutes. Greater than 50% of time was spent in counseling and coordination of care.  Follow-Up Instructions: Return in about 3 months (around 02/22/2018) for Rheumatoid arthritis.   Bo Merino, MD  Note - This record has been created using Editor, commissioning.  Chart creation errors have been sought, but may not always  have been located. Such creation errors do not reflect on  the  standard of medical care.

## 2017-11-22 ENCOUNTER — Ambulatory Visit (INDEPENDENT_AMBULATORY_CARE_PROVIDER_SITE_OTHER): Payer: 59 | Admitting: Rheumatology

## 2017-11-22 ENCOUNTER — Encounter: Payer: Self-pay | Admitting: Rheumatology

## 2017-11-22 VITALS — BP 153/96 | HR 81 | Resp 12 | Ht 66.73 in | Wt 174.5 lb

## 2017-11-22 DIAGNOSIS — M79641 Pain in right hand: Secondary | ICD-10-CM

## 2017-11-22 DIAGNOSIS — Z79899 Other long term (current) drug therapy: Secondary | ICD-10-CM | POA: Diagnosis not present

## 2017-11-22 DIAGNOSIS — G5601 Carpal tunnel syndrome, right upper limb: Secondary | ICD-10-CM | POA: Diagnosis not present

## 2017-11-22 DIAGNOSIS — Z8261 Family history of arthritis: Secondary | ICD-10-CM

## 2017-11-22 DIAGNOSIS — M0579 Rheumatoid arthritis with rheumatoid factor of multiple sites without organ or systems involvement: Secondary | ICD-10-CM

## 2017-11-22 DIAGNOSIS — M79642 Pain in left hand: Secondary | ICD-10-CM

## 2017-11-22 DIAGNOSIS — Z87442 Personal history of urinary calculi: Secondary | ICD-10-CM

## 2017-11-22 MED ORDER — HYDROXYCHLOROQUINE SULFATE 200 MG PO TABS: 200.0000 mg | ORAL_TABLET | Freq: Two times a day (BID) | ORAL | 0 refills | Status: DC

## 2017-11-22 NOTE — Patient Instructions (Addendum)
Hydroxychloroquine tablets What is this medicine? HYDROXYCHLOROQUINE (hye drox ee KLOR oh kwin) is used to treat rheumatoid arthritis and systemic lupus erythematosus. It is also used to treat malaria. This medicine may be used for other purposes; ask your health care provider or pharmacist if you have questions. COMMON BRAND NAME(S): Plaquenil, Quineprox What should I tell my health care provider before I take this medicine? They need to know if you have any of these conditions: -diabetes -eye disease, vision problems -G6PD deficiency -history of blood diseases -history of irregular heartbeat -if you often drink alcohol -kidney disease -liver disease -porphyria -psoriasis -seizures -an unusual or allergic reaction to chloroquine, hydroxychloroquine, other medicines, foods, dyes, or preservatives -pregnant or trying to get pregnant -breast-feeding How should I use this medicine? Take this medicine by mouth with a glass of water. Follow the directions on the prescription label. Avoid taking antacids within 4 hours of taking this medicine. It is best to separate these medicines by at least 4 hours. Do not cut, crush or chew this medicine. You can take it with or without food. If it upsets your stomach, take it with food. Take your medicine at regular intervals. Do not take your medicine more often than directed. Take all of your medicine as directed even if you think you are better. Do not skip doses or stop your medicine early. Talk to your pediatrician regarding the use of this medicine in children. While this drug may be prescribed for selected conditions, precautions do apply. Overdosage: If you think you have taken too much of this medicine contact a poison control center or emergency room at once. NOTE: This medicine is only for you. Do not share this medicine with others. What if I miss a dose? If you miss a dose, take it as soon as you can. If it is almost time for your next dose,  take only that dose. Do not take double or extra doses. What may interact with this medicine? Do not take this medicine with any of the following medications: -cisapride -dofetilide -dronedarone -live virus vaccines -penicillamine -pimozide -thioridazine -ziprasidone This medicine may also interact with the following medications: -ampicillin -antacids -cimetidine -cyclosporine -digoxin -medicines for diabetes, like insulin, glipizide, glyburide -medicines for seizures like carbamazepine, phenobarbital, phenytoin -mefloquine -methotrexate -other medicines that prolong the QT interval (cause an abnormal heart rhythm) -praziquantel This list may not describe all possible interactions. Give your health care provider a list of all the medicines, herbs, non-prescription drugs, or dietary supplements you use. Also tell them if you smoke, drink alcohol, or use illegal drugs. Some items may interact with your medicine. What should I watch for while using this medicine? Tell your doctor or healthcare professional if your symptoms do not start to get better or if they get worse. Avoid taking antacids within 4 hours of taking this medicine. It is best to separate these medicines by at least 4 hours. Tell your doctor or health care professional right away if you have any change in your eyesight. Your vision and blood may be tested before and during use of this medicine. This medicine can make you more sensitive to the sun. Keep out of the sun. If you cannot avoid being in the sun, wear protective clothing and use sunscreen. Do not use sun lamps or tanning beds/booths. What side effects may I notice from receiving this medicine? Side effects that you should report to your doctor or health care professional as soon as possible: -allergic reactions like skin rash,   itching or hives, swelling of the face, lips, or tongue -changes in vision -decreased hearing or ringing of the ears -redness,  blistering, peeling or loosening of the skin, including inside the mouth -seizures -sensitivity to light -signs and symptoms of a dangerous change in heartbeat or heart rhythm like chest pain; dizziness; fast or irregular heartbeat; palpitations; feeling faint or lightheaded, falls; breathing problems -signs and symptoms of liver injury like dark yellow or brown urine; general ill feeling or flu-like symptoms; light-colored stools; loss of appetite; nausea; right upper belly pain; unusually weak or tired; yellowing of the eyes or skin -signs and symptoms of low blood sugar such as feeling anxious; confusion; dizziness; increased hunger; unusually weak or tired; sweating; shakiness; cold; irritable; headache; blurred vision; fast heartbeat; loss of consciousness -uncontrollable head, mouth, neck, arm, or leg movements Side effects that usually do not require medical attention (report to your doctor or health care professional if they continue or are bothersome): -anxious -diarrhea -dizziness -hair loss -headache -irritable -loss of appetite -nausea, vomiting -stomach pain This list may not describe all possible side effects. Call your doctor for medical advice about side effects. You may report side effects to FDA at 1-800-FDA-1088. Where should I keep my medicine? Keep out of the reach of children. In children, this medicine can cause overdose with small doses. Store at room temperature between 15 and 30 degrees C (59 and 86 degrees F). Protect from moisture and light. Throw away any unused medicine after the expiration date. NOTE: This sheet is a summary. It may not cover all possible information. If you have questions about this medicine, talk to your doctor, pharmacist, or health care provider.  2018 Elsevier/Gold Standard (2015-12-03 14:16:15)  Standing Labs We placed an order today for your standing lab work.    Please come back and get your standing labs in 1 month and then every 3  months  We have open lab Monday through Friday from 8:30-11:30 AM and 1:30-4:00 PM  at the office of Dr. Bo Merino.   You may experience shorter wait times on Monday and Friday afternoons. The office is located at [address] [place], [place], [place] [address] No appointment is necessary.   Labs are drawn by [facility].  You may receive a bill from [facility] for your lab work. If you have any questions regarding directions or hours of operation,  please call [phone].     Please get pneumococcal vaccine and Shingrix vaccine.

## 2017-12-14 DIAGNOSIS — D3131 Benign neoplasm of right choroid: Secondary | ICD-10-CM | POA: Diagnosis not present

## 2017-12-14 DIAGNOSIS — Z79899 Other long term (current) drug therapy: Secondary | ICD-10-CM | POA: Diagnosis not present

## 2017-12-14 DIAGNOSIS — M0589 Other rheumatoid arthritis with rheumatoid factor of multiple sites: Secondary | ICD-10-CM | POA: Diagnosis not present

## 2017-12-22 ENCOUNTER — Other Ambulatory Visit: Payer: Self-pay | Admitting: Rheumatology

## 2017-12-22 ENCOUNTER — Other Ambulatory Visit: Payer: Self-pay | Admitting: *Deleted

## 2017-12-22 ENCOUNTER — Other Ambulatory Visit: Payer: Self-pay

## 2017-12-22 DIAGNOSIS — M0579 Rheumatoid arthritis with rheumatoid factor of multiple sites without organ or systems involvement: Secondary | ICD-10-CM | POA: Diagnosis not present

## 2017-12-22 DIAGNOSIS — Z79899 Other long term (current) drug therapy: Secondary | ICD-10-CM

## 2017-12-22 NOTE — Telephone Encounter (Addendum)
Last Visit: 11/22/17 Next Visit: 02/23/18 Labs: 07/22/17 , CMP WNL, CBC stable  Have not received patient's basleine PLQ eye exam   Left message to advise patient we are in need on PLQ eye exam.  Okay to refill 30 day supply PLQ?

## 2017-12-22 NOTE — Telephone Encounter (Signed)
ok 

## 2017-12-22 NOTE — Telephone Encounter (Signed)
Received results after message sent. PLQ Eye Exam: 12/14/17 WNL

## 2017-12-23 LAB — CBC WITH DIFFERENTIAL/PLATELET
BASOS ABS: 18 {cells}/uL (ref 0–200)
Basophils Relative: 0.2 %
Eosinophils Absolute: 220 cells/uL (ref 15–500)
Eosinophils Relative: 2.5 %
HEMATOCRIT: 41.8 % (ref 35.0–45.0)
Hemoglobin: 14.2 g/dL (ref 11.7–15.5)
Lymphs Abs: 942 cells/uL (ref 850–3900)
MCH: 31.2 pg (ref 27.0–33.0)
MCHC: 34 g/dL (ref 32.0–36.0)
MCV: 91.9 fL (ref 80.0–100.0)
MPV: 8.9 fL (ref 7.5–12.5)
Monocytes Relative: 7.7 %
NEUTROS PCT: 78.9 %
Neutro Abs: 6943 cells/uL (ref 1500–7800)
Platelets: 357 10*3/uL (ref 140–400)
RBC: 4.55 10*6/uL (ref 3.80–5.10)
RDW: 12 % (ref 11.0–15.0)
Total Lymphocyte: 10.7 %
WBC: 8.8 10*3/uL (ref 3.8–10.8)
WBCMIX: 678 {cells}/uL (ref 200–950)

## 2017-12-23 LAB — GLUCOSE 6 PHOSPHATE DEHYDROGENASE: G-6PDH: 20 U/g{Hb} (ref 7.0–20.5)

## 2017-12-23 LAB — COMPLETE METABOLIC PANEL WITH GFR
AG Ratio: 1.7 (calc) (ref 1.0–2.5)
ALKALINE PHOSPHATASE (APISO): 76 U/L (ref 33–130)
ALT: 16 U/L (ref 6–29)
AST: 18 U/L (ref 10–35)
Albumin: 4.3 g/dL (ref 3.6–5.1)
BUN: 11 mg/dL (ref 7–25)
CO2: 30 mmol/L (ref 20–32)
CREATININE: 0.88 mg/dL (ref 0.50–1.05)
Calcium: 9.9 mg/dL (ref 8.6–10.4)
Chloride: 106 mmol/L (ref 98–110)
GFR, EST NON AFRICAN AMERICAN: 72 mL/min/{1.73_m2} (ref >=60)
GFR, Est African American: 84 mL/min/{1.73_m2} (ref >=60)
GLUCOSE: 86 mg/dL (ref 65–99)
Globulin: 2.5 g/dL (calc) (ref 1.9–3.7)
Potassium: 4.5 mmol/L (ref 3.5–5.3)
Sodium: 143 mmol/L (ref 135–146)
Total Bilirubin: 0.6 mg/dL (ref 0.2–1.2)
Total Protein: 6.8 g/dL (ref 6.1–8.1)

## 2018-02-09 NOTE — Progress Notes (Signed)
Office Visit Note  Patient: Amber Sampson             Date of Birth: 1960/10/18           MRN: [id number]             PCP: Mayra Neer, MD Referring: Mayra Neer, MD Visit Date: 02/23/2018 Occupation: @GUAROCC @  Subjective:  Arthritis (Doing good, flare 01/02/18. left shoulder pain in September, 2019)   History of Present Illness: Amber Sampson is a 57 y.o. female with history of sero positive rheumatoid arthritis.  She states she had a flare in September when her left shoulder started hurting and she had pain and swelling in her right hand.  The symptoms lasted for couple of days says she took prednisone.  She has had no recurrence since then.  She states with the weather change she is noticing some stiffness in her right hand.  Activities of Daily Living:  Patient reports morning stiffness for 0 none.   Patient Denies nocturnal pain.  Difficulty dressing/grooming: Denies Difficulty climbing stairs: Denies Difficulty getting out of chair: Denies Difficulty using hands for taps, buttons, cutlery, and/or writing: Denies  Review of Systems  Constitutional: Negative for fatigue, night sweats, weight gain and weight loss.  HENT: Negative for mouth sores, trouble swallowing, trouble swallowing, mouth dryness and nose dryness.   Eyes: Negative for pain, redness, visual disturbance and dryness.  Respiratory: Negative for cough, shortness of breath and difficulty breathing.   Cardiovascular: Negative for chest pain, palpitations, hypertension, irregular heartbeat and swelling in legs/feet.  Gastrointestinal: Negative for blood in stool, constipation and diarrhea.  Endocrine: Negative for increased urination.  Genitourinary: Negative for difficulty urinating and vaginal dryness.  Musculoskeletal: Positive for arthralgias and joint pain. Negative for joint swelling, myalgias, muscle weakness, morning stiffness, muscle tenderness and myalgias.  Skin: Negative for color change,  rash, hair loss, skin tightness, ulcers and sensitivity to sunlight.  Allergic/Immunologic: Negative for susceptible to infections.  Neurological: Positive for numbness. Negative for dizziness, memory loss, night sweats and weakness.  Hematological: Positive for bruising/bleeding tendency. Negative for swollen glands.  Psychiatric/Behavioral: Negative for depressed mood and sleep disturbance. The patient is not nervous/anxious.     PMFS History:  Patient Active Problem List   Diagnosis Date Noted  . Rheumatoid arthritis involving multiple sites with positive rheumatoid factor ([place]) 11/09/2017  . History of kidney stones 10/03/2017  . Family history of rheumatoid arthritis 10/03/2017    History reviewed. No pertinent past medical history.  Family History  Problem Relation Age of Onset  . Heart Problems Mother        pacemaker   . Arthritis Father   . Healthy Sister   . Healthy Daughter   . Healthy Daughter   . Healthy Son    Past Surgical History:  Procedure Laterality Date  . CESAREAN SECTION    . CYSTOSCOPY  2018  . DILATION AND CURETTAGE OF UTERUS     x6  . HERNIA REPAIR    . UTERINE SEPTUM RESECTION  1990s   Social History   Social History Narrative  . Not on file    Objective: Vital Signs: BP 138/75 (BP Location: Left Arm, Patient Position: Sitting, Cuff Size: Normal)   Pulse 81   Resp 14   Ht 5' 7.5" (1.715 m)   Wt 171 lb (77.6 kg)   BMI 26.39 kg/m    Physical Exam  Constitutional: She is oriented to person, place, and time. She appears  well-developed and well-nourished.  HENT:  Head: Normocephalic and atraumatic.  Eyes: Conjunctivae and EOM are normal.  Neck: Normal range of motion.  Cardiovascular: Normal rate, regular rhythm, normal heart sounds and intact distal pulses.  Pulmonary/Chest: Effort normal and breath sounds normal.  Abdominal: Soft. Bowel sounds are normal.  Lymphadenopathy:    She has no cervical adenopathy.  Neurological: She is alert  and oriented to person, place, and time.  Skin: Skin is warm and dry. Capillary refill takes less than 2 seconds.  Psychiatric: She has a normal mood and affect. Her behavior is normal.  Nursing note and vitals reviewed.    Musculoskeletal Exam: C-spine thoracic and lumbar spine were in good range of motion with no synovitis.  Shoulder joints elbow joints wrist joint MCPs PIPs DIPs were in good range of motion with no synovitis.  Hip joints knee joints ankles MTPs PIPs were in good range of motion with no synovitis.  CDAI Exam: CDAI Score: 0.4  Patient Global Assessment: 2 (mm); Provider Global Assessment: 2 (mm) Swollen: 0 ; Tender: 0  Joint Exam   Not documented   There is currently no information documented on the homunculus. Go to the Rheumatology activity and complete the homunculus joint exam.  Investigation: No additional findings.  Imaging: No results found.  Recent Labs: Lab Results  Component Value Date   WBC 8.8 12/22/2017   HGB 14.2 12/22/2017   PLT 357 12/22/2017   NA 143 12/22/2017   K 4.5 12/22/2017   CL 106 12/22/2017   CO2 30 12/22/2017   GLUCOSE 86 12/22/2017   BUN 11 12/22/2017   CREATININE 0.88 12/22/2017   BILITOT 0.6 12/22/2017   AST 18 12/22/2017   ALT 16 12/22/2017   PROT 6.8 12/22/2017   CALCIUM 9.9 12/22/2017   GFRAA 84 12/22/2017    Speciality Comments: PLQ Eye Exam: 12/14/17 WNL @ [facility] Follow up 1 year  Procedures:  No procedures performed Allergies: Levaquin [levofloxacin in d5w] and Sulfa antibiotics   Assessment / Plan:     Visit Diagnoses: Rheumatoid arthritis involving multiple sites with positive rheumatoid factor (HCC) - RF positive, anti-CCP positive.  Patient is currently doing well with no synovitis on examination.  She states she had a flare in September.  She had some prednisone which she took for couple of days and the symptoms resolved.  We had detailed discussion if she has ongoing flares and remissions have  to consider methotrexate.  High risk medication use - PLQ 200 mg po bid. eye exam: 12/14/2017.  Her labs have been stable.  I will recheck labs at follow-up visit.  Carpal tunnel syndrome of right wrist - Status post injection by Dr. Amedeo Plenty August 01, 2017.  Patient symptoms have improved.  Pain in both hands - MRI consistent with extensor carpi radialis tenosynovitis  History of kidney stones  Family history of rheumatoid arthritis   Orders: No orders of the defined types were placed in this encounter.  No orders of the defined types were placed in this encounter.    Follow-Up Instructions: Return in about 3 months (around 05/26/2018) for Rheumatoid arthritis.   Bo Merino, MD  Note - This record has been created using Editor, commissioning.  Chart creation errors have been sought, but may not always  have been located. Such creation errors do not reflect on  the standard of medical care.

## 2018-02-23 ENCOUNTER — Encounter: Payer: Self-pay | Admitting: Rheumatology

## 2018-02-23 ENCOUNTER — Ambulatory Visit (INDEPENDENT_AMBULATORY_CARE_PROVIDER_SITE_OTHER): Payer: 59 | Admitting: Rheumatology

## 2018-02-23 VITALS — BP 138/75 | HR 81 | Resp 14 | Ht 67.5 in | Wt 171.0 lb

## 2018-02-23 DIAGNOSIS — Z79899 Other long term (current) drug therapy: Secondary | ICD-10-CM | POA: Diagnosis not present

## 2018-02-23 DIAGNOSIS — M79641 Pain in right hand: Secondary | ICD-10-CM

## 2018-02-23 DIAGNOSIS — G5601 Carpal tunnel syndrome, right upper limb: Secondary | ICD-10-CM | POA: Diagnosis not present

## 2018-02-23 DIAGNOSIS — M79642 Pain in left hand: Secondary | ICD-10-CM

## 2018-02-23 DIAGNOSIS — Z87442 Personal history of urinary calculi: Secondary | ICD-10-CM

## 2018-02-23 DIAGNOSIS — Z8261 Family history of arthritis: Secondary | ICD-10-CM

## 2018-02-23 DIAGNOSIS — M0579 Rheumatoid arthritis with rheumatoid factor of multiple sites without organ or systems involvement: Secondary | ICD-10-CM | POA: Diagnosis not present

## 2018-03-21 ENCOUNTER — Other Ambulatory Visit: Payer: Self-pay | Admitting: Rheumatology

## 2018-03-21 NOTE — Telephone Encounter (Signed)
Last Visit: 02/23/18 Next Visit: 06/07/18  Labs: 12/22/17 WNL PLQ Eye Exam: 12/14/17 WNL  Okay to refill per Dr. Estanislado Pandy

## 2018-05-24 NOTE — Progress Notes (Signed)
Office Visit Note  Patient: Amber Sampson             Date of Birth: 08-Oct-1960           MRN: [id number]             PCP: Mayra Neer, MD Referring: Mayra Neer, MD Visit Date: 06/07/2018 Occupation: @GUAROCC @  Subjective:  Medication management.   History of Present Illness: SHAWNDRA CLUTE is a 58 y.o. female with history of seropositive rheumatoid arthritis.  She states she has been tolerating Plaquenil well and has no side effects.  She is currently not having any joint pain.  She has some stiffness in her hands and exposed to the colder temperatures.  Currently has no numbness in her hands from carpal tunnel syndrome.  Activities of Daily Living:  Patient reports morning stiffness for 0 minutes.   Patient Denies nocturnal pain.  Difficulty dressing/grooming: Denies Difficulty climbing stairs: Denies Difficulty getting out of chair: Denies Difficulty using hands for taps, buttons, cutlery, and/or writing: Denies  Review of Systems  Constitutional: Negative for fatigue.  HENT: Negative for mouth sores, mouth dryness and nose dryness.   Eyes: Negative for redness and itching.  Respiratory: Negative for shortness of breath and wheezing.   Cardiovascular: Negative for chest pain and hypertension.  Gastrointestinal: Negative for abdominal pain, constipation, diarrhea, nausea and vomiting.  Endocrine: Negative for increased urination.  Genitourinary: Negative for painful urination, nocturia and pelvic pain.  Musculoskeletal: Negative for arthralgias, joint pain, joint swelling and morning stiffness.  Skin: Negative for rash, hair loss and redness.  Allergic/Immunologic: Negative for susceptible to infections.  Neurological: Negative for dizziness, light-headedness, numbness, headaches, memory loss and weakness.  Hematological: Positive for bruising/bleeding tendency.  Psychiatric/Behavioral: Negative for confusion. The patient is not nervous/anxious.     PMFS  History:  Patient Active Problem List   Diagnosis Date Noted  . Rheumatoid arthritis involving multiple sites with positive rheumatoid factor ([place]) 11/09/2017  . History of kidney stones 10/03/2017  . Family history of rheumatoid arthritis 10/03/2017    History reviewed. No pertinent past medical history.  Family History  Problem Relation Age of Onset  . Heart Problems Mother        pacemaker   . Arthritis Father   . Healthy Sister   . Healthy Daughter   . Healthy Daughter   . Healthy Son    Past Surgical History:  Procedure Laterality Date  . CESAREAN SECTION    . CYSTOSCOPY  2018  . DILATION AND CURETTAGE OF UTERUS     x6  . HERNIA REPAIR    . UTERINE SEPTUM RESECTION  1990s   Social History   Social History Narrative  . Not on file    There is no immunization history on file for this patient.   Objective: Vital Signs: BP 133/74 (BP Location: Left Arm, Patient Position: Sitting, Cuff Size: Normal)   Pulse 78   Resp 14   Ht 5' 7.5" (1.715 m)   Wt 164 lb (74.4 kg)   BMI 25.31 kg/m    Physical Exam Vitals signs and nursing note reviewed.  Constitutional:      Appearance: She is well-developed.  HENT:     Head: Normocephalic and atraumatic.  Eyes:     Conjunctiva/sclera: Conjunctivae normal.  Neck:     Musculoskeletal: Normal range of motion.  Cardiovascular:     Rate and Rhythm: Normal rate and regular rhythm.     Heart sounds: Normal  heart sounds.  Pulmonary:     Effort: Pulmonary effort is normal.     Breath sounds: Normal breath sounds.  Abdominal:     General: Bowel sounds are normal.     Palpations: Abdomen is soft.  Lymphadenopathy:     Cervical: No cervical adenopathy.  Skin:    General: Skin is warm and dry.     Capillary Refill: Capillary refill takes less than 2 seconds.  Neurological:     Mental Status: She is alert and oriented to person, place, and time.  Psychiatric:        Behavior: Behavior normal.      Musculoskeletal Exam:  C-spine thoracic lumbar spine good range of motion.  Shoulder joints elbow joints wrist joint MCPs PIPs DIPs been good range of motion with no synovitis.  Possible clubbing was noted in her hands.  Hip joints knee joints ankles MTPs PIPs been good range of motion with no synovitis.  CDAI Exam: CDAI Score: 0.2  Patient Global Assessment: 1 (mm); Provider Global Assessment: 1 (mm) Swollen: 0 ; Tender: 0  Joint Exam   Not documented   There is currently no information documented on the homunculus. Go to the Rheumatology activity and complete the homunculus joint exam.  Investigation: No additional findings.  Imaging: No results found.  Recent Labs: Lab Results  Component Value Date   WBC 8.8 12/22/2017   HGB 14.2 12/22/2017   PLT 357 12/22/2017   NA 143 12/22/2017   K 4.5 12/22/2017   CL 106 12/22/2017   CO2 30 12/22/2017   GLUCOSE 86 12/22/2017   BUN 11 12/22/2017   CREATININE 0.88 12/22/2017   BILITOT 0.6 12/22/2017   AST 18 12/22/2017   ALT 16 12/22/2017   PROT 6.8 12/22/2017   CALCIUM 9.9 12/22/2017   GFRAA 84 12/22/2017    Speciality Comments: PLQ Eye Exam: 12/14/17 WNL @ [facility] Follow up 1 year  Procedures:  No procedures performed Allergies: Levaquin [levofloxacin in d5w] and Sulfa antibiotics   Assessment / Plan:     Visit Diagnoses: Rheumatoid arthritis involving multiple sites with positive rheumatoid factor (HCC) -  RF positive, anti-CCP positive.  Patient has responded very well to Plaquenil.  She has no active synovitis on examination.  She is very pleased with the results.  The carpal tunnel syndrome has resolved.  Clubbing of fingernails-I noticed some clubbing in her fingernails.  She is smoker.  I offered chest x-ray which she declined.  High risk medication use - PLQ 200 mg po bid. eye exam: 12/14/2017.  - Plan: CBC with Differential/Platelet, COMPLETE METABOLIC PANEL WITH GFR.  Her labs will be done today.  History of kidney  stones  Family history of rheumatoid arthritis  Tobacco use -smoking cessation was discussed.  Orders: Orders Placed This Encounter  Procedures  . CBC with Differential/Platelet  . COMPLETE METABOLIC PANEL WITH GFR   No orders of the defined types were placed in this encounter.     Follow-Up Instructions: Return in about 5 months (around 11/05/2018) for Rheumatoid arthritis.   Bo Merino, MD  Note - This record has been created using Editor, commissioning.  Chart creation errors have been sought, but may not always  have been located. Such creation errors do not reflect on  the standard of medical care.

## 2018-06-07 ENCOUNTER — Encounter: Payer: Self-pay | Admitting: Physician Assistant

## 2018-06-07 ENCOUNTER — Ambulatory Visit (INDEPENDENT_AMBULATORY_CARE_PROVIDER_SITE_OTHER): Payer: 59 | Admitting: Rheumatology

## 2018-06-07 VITALS — BP 133/74 | HR 78 | Resp 14 | Ht 67.5 in | Wt 164.0 lb

## 2018-06-07 DIAGNOSIS — Z87442 Personal history of urinary calculi: Secondary | ICD-10-CM | POA: Diagnosis not present

## 2018-06-07 DIAGNOSIS — M0579 Rheumatoid arthritis with rheumatoid factor of multiple sites without organ or systems involvement: Secondary | ICD-10-CM

## 2018-06-07 DIAGNOSIS — Z720 Tobacco use: Secondary | ICD-10-CM

## 2018-06-07 DIAGNOSIS — Z79899 Other long term (current) drug therapy: Secondary | ICD-10-CM | POA: Diagnosis not present

## 2018-06-07 DIAGNOSIS — Z8261 Family history of arthritis: Secondary | ICD-10-CM

## 2018-06-08 LAB — COMPLETE METABOLIC PANEL WITH GFR
AG Ratio: 2 (calc) (ref 1.0–2.5)
ALBUMIN MSPROF: 4.2 g/dL (ref 3.6–5.1)
ALKALINE PHOSPHATASE (APISO): 63 U/L (ref 37–153)
ALT: 18 U/L (ref 6–29)
AST: 21 U/L (ref 10–35)
BILIRUBIN TOTAL: 0.8 mg/dL (ref 0.2–1.2)
BUN / CREAT RATIO: 15 (calc) (ref 6–22)
BUN: 16 mg/dL (ref 7–25)
CHLORIDE: 105 mmol/L (ref 98–110)
CO2: 30 mmol/L (ref 20–32)
Calcium: 9.6 mg/dL (ref 8.6–10.4)
Creat: 1.08 mg/dL — ABNORMAL HIGH (ref 0.50–1.05)
GFR, EST AFRICAN AMERICAN: 65 mL/min/{1.73_m2} (ref >=60)
GFR, Est Non African American: 56 mL/min/{1.73_m2} — ABNORMAL LOW (ref >=60)
GLOBULIN: 2.1 g/dL (ref 1.9–3.7)
GLUCOSE: 73 mg/dL (ref 65–99)
Potassium: 4.9 mmol/L (ref 3.5–5.3)
Sodium: 142 mmol/L (ref 135–146)
TOTAL PROTEIN: 6.3 g/dL (ref 6.1–8.1)

## 2018-06-08 LAB — CBC WITH DIFFERENTIAL/PLATELET
ABSOLUTE MONOCYTES: 635 {cells}/uL (ref 200–950)
BASOS PCT: 0.6 %
Basophils Absolute: 30 cells/uL (ref 0–200)
EOS ABS: 210 {cells}/uL (ref 15–500)
Eosinophils Relative: 4.2 %
HEMATOCRIT: 43.5 % (ref 35.0–45.0)
HEMOGLOBIN: 14.7 g/dL (ref 11.7–15.5)
LYMPHS ABS: 1520 {cells}/uL (ref 850–3900)
MCH: 31.7 pg (ref 27.0–33.0)
MCHC: 33.8 g/dL (ref 32.0–36.0)
MCV: 93.8 fL (ref 80.0–100.0)
MPV: 9 fL (ref 7.5–12.5)
Monocytes Relative: 12.7 %
Neutro Abs: 2605 cells/uL (ref 1500–7800)
Neutrophils Relative %: 52.1 %
Platelets: 265 10*3/uL (ref 140–400)
RBC: 4.64 10*6/uL (ref 3.80–5.10)
RDW: 12.4 % (ref 11.0–15.0)
TOTAL LYMPHOCYTE: 30.4 %
WBC: 5 10*3/uL (ref 3.8–10.8)

## 2018-06-08 NOTE — Progress Notes (Signed)
Creatinine is elevated.  Please advise patient to avoid all NSAIDs.  Increase water intake.  You should repeat BMP in 1 month.

## 2018-06-13 ENCOUNTER — Telehealth: Payer: Self-pay | Admitting: *Deleted

## 2018-06-13 DIAGNOSIS — M0579 Rheumatoid arthritis with rheumatoid factor of multiple sites without organ or systems involvement: Secondary | ICD-10-CM

## 2018-06-13 DIAGNOSIS — Z79899 Other long term (current) drug therapy: Secondary | ICD-10-CM

## 2018-06-13 NOTE — Telephone Encounter (Signed)
Creatinine is elevated. Please advise patient to avoid all NSAIDs. Increase water intake. You should repeat BMP in 1 month.

## 2018-06-14 ENCOUNTER — Other Ambulatory Visit: Payer: Self-pay | Admitting: Rheumatology

## 2018-06-14 NOTE — Telephone Encounter (Signed)
Last Visit: 06/07/18 Next Visit: 11/02/18 Labs: 06/07/18 Creatinine is elevated.  PLQ Eye Exam: 12/14/17 WNL   Okay to refill per Dr. Estanislado Pandy

## 2018-07-21 ENCOUNTER — Other Ambulatory Visit: Payer: Self-pay

## 2018-07-21 MED ORDER — HYDROXYCHLOROQUINE SULFATE 200 MG PO TABS: 200.0000 mg | ORAL_TABLET | Freq: Two times a day (BID) | ORAL | 0 refills | Status: DC

## 2018-07-21 NOTE — Progress Notes (Signed)
Okay to refill PLQ per Dr. Estanislado Pandy. Patient has been notified.

## 2018-08-25 DIAGNOSIS — R310 Gross hematuria: Secondary | ICD-10-CM | POA: Diagnosis not present

## 2018-08-25 DIAGNOSIS — N390 Urinary tract infection, site not specified: Secondary | ICD-10-CM | POA: Diagnosis not present

## 2018-08-25 DIAGNOSIS — R8271 Bacteriuria: Secondary | ICD-10-CM | POA: Diagnosis not present

## 2018-10-19 NOTE — Progress Notes (Signed)
Office Visit Note  Patient: Amber Sampson             Date of Birth: 09-28-1960           MRN: [id number]             PCP: Mayra Neer, MD Referring: Mayra Neer, MD Visit Date: 11/02/2018 Occupation: @GUAROCC @  Subjective:  Medication monitoring.   History of Present Illness: Amber Sampson is a 58 y.o. female with history of seropositive rheumatoid arthritis.  She states she has been doing well on Plaquenil.  She is occasional stiffness in her hands.  She denies any history of joint swelling.  He has been tolerating Plaquenil well.  Activities of Daily Living:  Patient reports morning stiffness for 0 minutes.   Patient Denies nocturnal pain.  Difficulty dressing/grooming: Denies Difficulty climbing stairs: Denies Difficulty getting out of chair: Denies Difficulty using hands for taps, buttons, cutlery, and/or writing: Denies  Review of Systems  Constitutional: Negative for fatigue.  HENT: Negative for mouth sores, mouth dryness and nose dryness.   Eyes: Negative for itching and dryness.  Respiratory: Negative for shortness of breath and difficulty breathing.   Cardiovascular: Negative for chest pain and palpitations.  Gastrointestinal: Negative for blood in stool, constipation and diarrhea.  Endocrine: Negative for increased urination.  Genitourinary: Negative for painful urination and pelvic pain.  Musculoskeletal: Negative for arthralgias, joint pain, joint swelling and morning stiffness.  Skin: Negative for rash, hair loss and redness.  Allergic/Immunologic: Negative for susceptible to infections.  Neurological: Negative for dizziness, light-headedness, memory loss and weakness.  Hematological: Positive for bruising/bleeding tendency.  Psychiatric/Behavioral: Negative for confusion. The patient is not nervous/anxious.     PMFS History:  Patient Active Problem List   Diagnosis Date Noted  . Rheumatoid arthritis involving multiple sites with positive  rheumatoid factor ([place]) 11/09/2017  . History of kidney stones 10/03/2017  . Family history of rheumatoid arthritis 10/03/2017    History reviewed. No pertinent past medical history.  Family History  Problem Relation Age of Onset  . Heart Problems Mother        pacemaker   . Arthritis Father   . Healthy Sister   . Healthy Daughter   . Healthy Daughter   . Healthy Son    Past Surgical History:  Procedure Laterality Date  . CESAREAN SECTION    . CYSTOSCOPY  2018  . DILATION AND CURETTAGE OF UTERUS     x6  . HERNIA REPAIR    . UTERINE SEPTUM RESECTION  1990s   Social History   Social History Narrative  . Not on file   Immunization History  Administered Date(s) Administered  . Influenza-Unspecified 01/31/2017  . Pneumococcal Conjugate-13 03/11/2018     Objective: Vital Signs: BP 131/82 (BP Location: Left Arm, Patient Position: Sitting, Cuff Size: Normal)   Pulse 85   Resp 13   Ht 5' 7.5" (1.715 m)   Wt 146 lb 3.2 oz (66.3 kg)   BMI 22.56 kg/m    Physical Exam Vitals signs and nursing note reviewed.  Constitutional:      Appearance: She is well-developed.  HENT:     Head: Normocephalic and atraumatic.  Eyes:     Conjunctiva/sclera: Conjunctivae normal.  Neck:     Musculoskeletal: Normal range of motion.  Cardiovascular:     Rate and Rhythm: Normal rate and regular rhythm.     Heart sounds: Normal heart sounds.  Pulmonary:     Effort: Pulmonary effort  is normal.     Breath sounds: Normal breath sounds.  Abdominal:     General: Bowel sounds are normal.     Palpations: Abdomen is soft.  Lymphadenopathy:     Cervical: No cervical adenopathy.  Skin:    General: Skin is warm and dry.     Capillary Refill: Capillary refill takes less than 2 seconds.  Neurological:     Mental Status: She is alert and oriented to person, place, and time.  Psychiatric:        Behavior: Behavior normal.      Musculoskeletal Exam: C-spine thoracic and lumbar spine with good  range of motion.  Shoulder joints, elbow joints, wrist joints, MCPs, PIPs, DIPs with good range of motion with no synovitis.  Hip joints, knee joints, ankles and MTPs with good range of motion with no synovitis.  CDAI Exam: CDAI Score: 0  Patient Global: 0 mm; Provider Global: 0 mm Swollen: 0 ; Tender: 0  Joint Exam   No joint exam has been documented for this visit   There is currently no information documented on the homunculus. Go to the Rheumatology activity and complete the homunculus joint exam.  Investigation: No additional findings.  Imaging: No results found.  Recent Labs: Lab Results  Component Value Date   WBC 5.0 06/07/2018   HGB 14.7 06/07/2018   PLT 265 06/07/2018   NA 142 06/07/2018   K 4.9 06/07/2018   CL 105 06/07/2018   CO2 30 06/07/2018   GLUCOSE 73 06/07/2018   BUN 16 06/07/2018   CREATININE 1.08 (H) 06/07/2018   BILITOT 0.8 06/07/2018   AST 21 06/07/2018   ALT 18 06/07/2018   PROT 6.3 06/07/2018   CALCIUM 9.6 06/07/2018   GFRAA 65 06/07/2018    Speciality Comments: PLQ Eye Exam: 12/14/17 WNL @ [facility]. Follow up 1 year  Procedures:  No procedures performed Allergies: Levaquin [levofloxacin in d5w] and Sulfa antibiotics   Assessment / Plan:     Visit Diagnoses: Rheumatoid arthritis involving multiple sites with positive rheumatoid factor (HCC) - RF positive, anti-CCP positive.  -Patient has done very well on Plaquenil.  She had no synovitis on examination today.  She will continue Plaquenil.    High risk medication use - Plaquenil 200 mg 1 tablet twice daily.  Last Plaquenil eye exam normal on 12/14/2017.  Most recent CBC/CMP within normal limits except for elevated creatinine on 06/07/2018.  She was to return in 1 month to recheck BMP.  Due for CBC/CMP today and will monitor every 5 months.  Standing orders are in place.  She is received the Prevnar 13 vaccine.  Recommend annual flu, Pneumovax 23, and Shingrix vaccines as indicated for  immunosuppressant therapy.  - Plan: CBC with Differential/Platelet, COMPLETE METABOLIC PANEL WITH GFR,   Family history of rheumatoid arthritis   Tobacco use -patient has not quit smoking yet.  She states she has been under a lot of stress.  Association of smoking with rheumatoid arthritis was discussed.  Smoking cessation was discussed at length.    History of kidney stones - Calcium oxalate.  Patient states that she recently passed a stone.  Orders: No orders of the defined types were placed in this encounter.  No orders of the defined types were placed in this encounter.    Follow-Up Instructions: Return in about 5 months (around 04/04/2019) for Rheumatoid arthritis.   Bo Merino, MD  Note - This record has been created using Editor, commissioning.  Chart  creation errors have been sought, but may not always  have been located. Such creation errors do not reflect on  the standard of medical care.

## 2018-11-02 ENCOUNTER — Ambulatory Visit (INDEPENDENT_AMBULATORY_CARE_PROVIDER_SITE_OTHER): Payer: 59 | Admitting: Rheumatology

## 2018-11-02 ENCOUNTER — Encounter: Payer: Self-pay | Admitting: Rheumatology

## 2018-11-02 ENCOUNTER — Other Ambulatory Visit: Payer: Self-pay

## 2018-11-02 VITALS — BP 131/82 | HR 85 | Resp 13 | Ht 67.5 in | Wt 146.2 lb

## 2018-11-02 DIAGNOSIS — Z79899 Other long term (current) drug therapy: Secondary | ICD-10-CM | POA: Diagnosis not present

## 2018-11-02 DIAGNOSIS — Z8261 Family history of arthritis: Secondary | ICD-10-CM | POA: Diagnosis not present

## 2018-11-02 DIAGNOSIS — Z87442 Personal history of urinary calculi: Secondary | ICD-10-CM

## 2018-11-02 DIAGNOSIS — Z720 Tobacco use: Secondary | ICD-10-CM | POA: Diagnosis not present

## 2018-11-02 DIAGNOSIS — M0579 Rheumatoid arthritis with rheumatoid factor of multiple sites without organ or systems involvement: Secondary | ICD-10-CM | POA: Diagnosis not present

## 2018-11-03 LAB — COMPLETE METABOLIC PANEL WITH GFR
AG Ratio: 2.3 (calc) (ref 1.0–2.5)
ALT: 18 U/L (ref 6–29)
AST: 20 U/L (ref 10–35)
Albumin: 4.5 g/dL (ref 3.6–5.1)
Alkaline phosphatase (APISO): 71 U/L (ref 37–153)
BUN: 11 mg/dL (ref 7–25)
CO2: 28 mmol/L (ref 20–32)
Calcium: 9.5 mg/dL (ref 8.6–10.4)
Chloride: 105 mmol/L (ref 98–110)
Creat: 0.85 mg/dL (ref 0.50–1.05)
GFR, Est African American: 87 mL/min/{1.73_m2} (ref >=60)
GFR, Est Non African American: 75 mL/min/{1.73_m2} (ref >=60)
Globulin: 2 g/dL (calc) (ref 1.9–3.7)
Glucose, Bld: 86 mg/dL (ref 65–99)
Potassium: 4.3 mmol/L (ref 3.5–5.3)
Sodium: 141 mmol/L (ref 135–146)
Total Bilirubin: 0.7 mg/dL (ref 0.2–1.2)
Total Protein: 6.5 g/dL (ref 6.1–8.1)

## 2018-11-03 LAB — CBC WITH DIFFERENTIAL/PLATELET
Absolute Monocytes: 517 cells/uL (ref 200–950)
Basophils Absolute: 28 cells/uL (ref 0–200)
Basophils Relative: 0.6 %
Eosinophils Absolute: 80 cells/uL (ref 15–500)
Eosinophils Relative: 1.7 %
HCT: 43.8 % (ref 35.0–45.0)
Hemoglobin: 14.9 g/dL (ref 11.7–15.5)
Lymphs Abs: 1598 cells/uL (ref 850–3900)
MCH: 31.7 pg (ref 27.0–33.0)
MCHC: 34 g/dL (ref 32.0–36.0)
MCV: 93.2 fL (ref 80.0–100.0)
MPV: 9.3 fL (ref 7.5–12.5)
Monocytes Relative: 11 %
Neutro Abs: 2477 cells/uL (ref 1500–7800)
Neutrophils Relative %: 52.7 %
Platelets: 258 10*3/uL (ref 140–400)
RBC: 4.7 10*6/uL (ref 3.80–5.10)
RDW: 12.2 % (ref 11.0–15.0)
Total Lymphocyte: 34 %
WBC: 4.7 10*3/uL (ref 3.8–10.8)

## 2018-12-08 ENCOUNTER — Other Ambulatory Visit: Payer: Self-pay | Admitting: Rheumatology

## 2018-12-08 NOTE — Telephone Encounter (Signed)
Last Visit: 11/02/18 Next Visit: 04/05/19 Labs: 11/02/18 WNL PLQ Eye Exam: 12/14/17 WNL PLQ Eye Exam: 12/14/17 WNL Patient reminded she is due to update PLQ eye exam.  Okay to refill per Dr. Estanislado Pandy

## 2018-12-20 ENCOUNTER — Encounter: Payer: Self-pay | Admitting: Rheumatology

## 2019-03-22 NOTE — Progress Notes (Signed)
Virtual Visit via Telephone Note  I connected with Amber Sampson on 04/05/19 at  3:00 PM EST by telephone and verified that I am speaking with the correct person using two identifiers.  Location: Patient: Home  Provider: Clinic  This service was conducted via virtual visit.  The patient was located at home. I was located in my office.  Consent was obtained prior to the virtual visit and is aware of possible charges through their insurance for this visit.  The patient is an established patient.  Dr. Estanislado Pandy, MD conducted the virtual visit and Hazel Sams, PA-C acted as scribe during the service.  Office staff helped with scheduling follow up visits after the service was conducted.   I discussed the limitations, risks, security and privacy concerns of performing an evaluation and management service by telephone and the availability of in person appointments. I also discussed with the patient that there may be a patient responsible charge related to this service. The patient expressed understanding and agreed to proceed.  CC: Medication monitoring  History of Present Illness: Patient is a 58 year old female with past medical history of seropositive rheumatoid arthritis.  She is taking plaquenil 200 mg 1 tablet by mouth twice daily.  She is tolerating PLQ without any side effects.  She has not had any recent rheumatoid arthritis flares.  She has no joint pain or joint swelling at this time. She has not had any morning stiffness recently.  She has no concerns at this time.   Review of Systems  Constitutional: Negative for fever and malaise/fatigue.  Eyes: Negative for photophobia, pain, discharge and redness.  Respiratory: Negative for cough, shortness of breath and wheezing.   Cardiovascular: Negative for chest pain and palpitations.  Gastrointestinal: Negative for blood in stool, constipation and diarrhea.  Genitourinary: Negative for dysuria.  Musculoskeletal: Negative for back pain,  joint pain, myalgias and neck pain.  Skin: Negative for rash.  Neurological: Negative for dizziness and headaches.  Psychiatric/Behavioral: Negative for depression. The patient is not nervous/anxious and does not have insomnia.       Observations/Objective: Physical Exam  Constitutional: She is oriented to person, place, and time.  Neurological: She is alert and oriented to person, place, and time.  Psychiatric: Mood, memory, affect and judgment normal.   Patient reports morning stiffness for 0 minutes.   Patient denies nocturnal pain.  Difficulty dressing/grooming: Denies Difficulty climbing stairs: Denies Difficulty getting out of chair: Denies Difficulty using hands for taps, buttons, cutlery, and/or writing: Denies   Assessment and Plan: Visit Diagnoses: Rheumatoid arthritis involving multiple sites with positive rheumatoid factor (HCC) - RF positive, anti-CCP positive: She has not had any recent rheumatoid arthritis flares.  She has no joint pain or joint swelling at this time.  She has no morning stiffness.  She has been taking plaquenil 200 mg 1 tablet by mouth twice daily.  She has not had a flare in over 1.5 years.  She was advised to reduce plaquenil to 200 mg 1 tablet BID M-F only.  She was advised to increase her dose if she develops more discomfort or inflammation. She will follow up in 5 months.   High risk medication use - Plaquenil 200 mg 1 tablet twice daily Monday through Friday only. Last Plaquenil eye exam normal on 12/14/2017. She is due to update PLQ eye exam. CBC and CMP were WNL on 11/02/18.  Standing orders are in place. She is due to update lab work.  Other medical conditions are listed as follows:   Family history of rheumatoid arthritis   Tobacco use: She has been cutting back on smoking.  History of kidney stones - Calcium oxalate.  No recent stones.   Follow Up Instructions: She will follow up in 5 months.   I discussed the assessment and  treatment plan with the patient. The patient was provided an opportunity to ask questions and all were answered. The patient agreed with the plan and demonstrated an understanding of the instructions.   The patient was advised to call back or seek an in-person evaluation if the symptoms worsen or if the condition fails to improve as anticipated.  I provided 15 minutes of non-face-to-face time during this encounter.   Bo Merino, MD   Scribed by-   Hazel Sams PA-C

## 2019-04-05 ENCOUNTER — Telehealth (INDEPENDENT_AMBULATORY_CARE_PROVIDER_SITE_OTHER): Payer: 59 | Admitting: Rheumatology

## 2019-04-05 ENCOUNTER — Encounter: Payer: Self-pay | Admitting: Rheumatology

## 2019-04-05 ENCOUNTER — Other Ambulatory Visit: Payer: Self-pay

## 2019-04-05 DIAGNOSIS — Z87442 Personal history of urinary calculi: Secondary | ICD-10-CM

## 2019-04-05 DIAGNOSIS — M0579 Rheumatoid arthritis with rheumatoid factor of multiple sites without organ or systems involvement: Secondary | ICD-10-CM

## 2019-04-05 DIAGNOSIS — Z720 Tobacco use: Secondary | ICD-10-CM

## 2019-04-05 DIAGNOSIS — Z8261 Family history of arthritis: Secondary | ICD-10-CM | POA: Diagnosis not present

## 2019-04-05 DIAGNOSIS — Z79899 Other long term (current) drug therapy: Secondary | ICD-10-CM

## 2019-04-10 ENCOUNTER — Other Ambulatory Visit: Payer: Self-pay | Admitting: *Deleted

## 2019-04-10 MED ORDER — HYDROXYCHLOROQUINE SULFATE 200 MG PO TABS: ORAL_TABLET | ORAL | 2 refills | Status: DC

## 2019-04-18 ENCOUNTER — Other Ambulatory Visit: Payer: Self-pay | Admitting: Rheumatology

## 2019-04-18 DIAGNOSIS — M0579 Rheumatoid arthritis with rheumatoid factor of multiple sites without organ or systems involvement: Secondary | ICD-10-CM

## 2019-04-18 DIAGNOSIS — Z79899 Other long term (current) drug therapy: Secondary | ICD-10-CM

## 2019-04-18 NOTE — Telephone Encounter (Signed)
Last Visit: 04/05/2019 Next Visit: message sent to the front desk to schedule. Labs: 11/02/2018 CBC and CMP WNL. Eye exam:  12/14/2017, 12/2018 per patient.   Patient will have eye exam faxed over. Advised patient she is due to update labs, [facility] will [facility]. Lab orders have been released.   Okay to refill 30 day supply, per Dr. Estanislado Pandy.

## 2019-04-20 ENCOUNTER — Telehealth: Payer: Self-pay | Admitting: Rheumatology

## 2019-04-20 NOTE — Telephone Encounter (Signed)
LMOM for patient to call and schedule her 5 month follow-up appointment (around 09/03/2019) for RA

## 2019-04-20 NOTE — Telephone Encounter (Signed)
-----   Message from Shona Needles, RT sent at 04/10/2019 10:27 AM EST ----- Regarding: 5 MONTH F/U

## 2019-04-25 LAB — COMPLETE METABOLIC PANEL WITH GFR
AG Ratio: 2.3 (calc) (ref 1.0–2.5)
ALT: 15 U/L (ref 6–29)
AST: 17 U/L (ref 10–35)
Albumin: 4.6 g/dL (ref 3.6–5.1)
Alkaline phosphatase (APISO): 74 U/L (ref 37–153)
BUN: 8 mg/dL (ref 7–25)
CO2: 26 mmol/L (ref 20–32)
Calcium: 9.6 mg/dL (ref 8.6–10.4)
Chloride: 108 mmol/L (ref 98–110)
Creat: 0.87 mg/dL (ref 0.50–0.99)
GFR, Est African American: 84 mL/min/{1.73_m2} (ref >=60)
GFR, Est Non African American: 72 mL/min/{1.73_m2} (ref >=60)
Globulin: 2 g/dL (calc) (ref 1.9–3.7)
Glucose, Bld: 106 mg/dL — ABNORMAL HIGH (ref 65–99)
Potassium: 4.4 mmol/L (ref 3.5–5.3)
Sodium: 145 mmol/L (ref 135–146)
Total Bilirubin: 0.8 mg/dL (ref 0.2–1.2)
Total Protein: 6.6 g/dL (ref 6.1–8.1)

## 2019-04-25 LAB — CBC WITH DIFFERENTIAL/PLATELET
Absolute Monocytes: 528 cells/uL (ref 200–950)
Basophils Absolute: 18 cells/uL (ref 0–200)
Basophils Relative: 0.3 %
Eosinophils Absolute: 42 cells/uL (ref 15–500)
Eosinophils Relative: 0.7 %
HCT: 48.5 % — ABNORMAL HIGH (ref 35.0–45.0)
Hemoglobin: 16.3 g/dL — ABNORMAL HIGH (ref 11.7–15.5)
Lymphs Abs: 1920 cells/uL (ref 850–3900)
MCH: 31.8 pg (ref 27.0–33.0)
MCHC: 33.6 g/dL (ref 32.0–36.0)
MCV: 94.5 fL (ref 80.0–100.0)
MPV: 9.2 fL (ref 7.5–12.5)
Monocytes Relative: 8.8 %
Neutro Abs: 3492 cells/uL (ref 1500–7800)
Neutrophils Relative %: 58.2 %
Platelets: 286 10*3/uL (ref 140–400)
RBC: 5.13 10*6/uL — ABNORMAL HIGH (ref 3.80–5.10)
RDW: 12.1 % (ref 11.0–15.0)
Total Lymphocyte: 32 %
WBC: 6 10*3/uL (ref 3.8–10.8)

## 2019-04-25 NOTE — Telephone Encounter (Signed)
Labs are stable.  We will continue to monitor.

## 2019-05-18 ENCOUNTER — Other Ambulatory Visit: Payer: Self-pay | Admitting: *Deleted

## 2019-05-18 DIAGNOSIS — M0579 Rheumatoid arthritis with rheumatoid factor of multiple sites without organ or systems involvement: Secondary | ICD-10-CM

## 2019-05-18 MED ORDER — HYDROXYCHLOROQUINE SULFATE 200 MG PO TABS: ORAL_TABLET | ORAL | 0 refills | Status: DC

## 2019-05-18 NOTE — Telephone Encounter (Signed)
Refill request received via fax  Last Visit: 04/05/2019 Next Visit: message sent to the front desk to schedule. Labs: 04/24/19 stable Eye exam:  12/20/18 WNL  Okay to refill per Dr. Estanislado Pandy

## 2019-07-26 ENCOUNTER — Other Ambulatory Visit: Payer: Self-pay | Admitting: Rheumatology

## 2019-07-26 DIAGNOSIS — M0579 Rheumatoid arthritis with rheumatoid factor of multiple sites without organ or systems involvement: Secondary | ICD-10-CM

## 2019-08-27 ENCOUNTER — Other Ambulatory Visit: Payer: Self-pay | Admitting: Rheumatology

## 2019-08-27 DIAGNOSIS — M0579 Rheumatoid arthritis with rheumatoid factor of multiple sites without organ or systems involvement: Secondary | ICD-10-CM

## 2019-08-27 NOTE — Telephone Encounter (Signed)
Last Visit: 04/05/2019 telemedicine  Next Visit: 09/05/2019 Labs: 04/24/2019 Labs are stable. We will continue to monitor. Eye exam: 12/20/2018   Okay to refill per Dr. Estanislado Pandy.

## 2019-08-30 NOTE — Progress Notes (Signed)
Office Visit Note  Patient: Amber Sampson             Date of Birth: 1961-03-26           MRN: [id number]             PCP: Mayra Neer, MD Referring: Mayra Neer, MD Visit Date: 09/05/2019 Occupation: @GUAROCC @  Subjective:  Medication monitoring  History of Present Illness: [name] is a 59 y.o. female with history of seropositive rheumatoid arthritis.  Patient is taking Plaquenil 200 mg by mouth twice daily Monday through Friday.  She is tolerating Plaquenil without any side effects.  She does not miss any doses recently she denies any recent rheumatoid arthritis flares.  She denies any joint pain or joint swelling at this time.  She denies any morning stiffness.  She has not had any new concerns.  She would like to reduce the dose of Plaquenil since she has been doing so well.  Activities of Daily Living:  Patient reports morning stiffness for 0 minutes.   Patient Denies nocturnal pain.  Difficulty dressing/grooming: Denies Difficulty climbing stairs: Denies Difficulty getting out of chair: Denies Difficulty using hands for taps, buttons, cutlery, and/or writing: Denies  Review of Systems  Constitutional: Negative for fatigue.  HENT: Negative for mouth sores, mouth dryness and nose dryness.   Eyes: Negative for pain, itching, visual disturbance and dryness.  Respiratory: Negative for cough, hemoptysis, shortness of breath and difficulty breathing.   Cardiovascular: Negative for chest pain, palpitations, hypertension and swelling in legs/feet.  Gastrointestinal: Negative for blood in stool, constipation and diarrhea.  Endocrine: Negative for increased urination.  Genitourinary: Negative for difficulty urinating and painful urination.  Musculoskeletal: Negative for arthralgias, joint pain, joint swelling, myalgias, muscle weakness, morning stiffness, muscle tenderness and myalgias.  Skin: Negative for color change, pallor, rash, hair loss, nodules/bumps, redness,  skin tightness, ulcers and sensitivity to sunlight.  Allergic/Immunologic: Negative for susceptible to infections.  Neurological: Positive for headaches. Negative for dizziness, numbness, memory loss and weakness.  Hematological: Positive for bruising/bleeding tendency. Negative for swollen glands.  Psychiatric/Behavioral: Negative for depressed mood, confusion and sleep disturbance. The patient is not nervous/anxious.     PMFS History:  Patient Active Problem List   Diagnosis Date Noted  . Rheumatoid arthritis involving multiple sites with positive rheumatoid factor ([place]) 11/09/2017  . History of kidney stones 10/03/2017  . Family history of rheumatoid arthritis 10/03/2017    Past Medical History:  Diagnosis Date  . Carpal tunnel syndrome   . Kidney stones   . Rheumatoid arthritis ([place] Sampson)     Family History  Problem Relation Age of Onset  . Heart Problems Mother        pacemaker   . Arthritis Father   . Dementia Sister   . Healthy Daughter   . Healthy Daughter   . Healthy Son    Past Surgical History:  Procedure Laterality Date  . CESAREAN SECTION    . CYSTOSCOPY  2018  . DILATION AND CURETTAGE OF UTERUS     x6  . HERNIA REPAIR    . UTERINE SEPTUM RESECTION  1990s   Social History   Social History Narrative  . Not on file   Immunization History  Administered Date(s) Administered  . Influenza-Unspecified 01/31/2017  . Pneumococcal Conjugate-13 03/11/2018     Objective: Vital Signs: BP (!) 146/76 (BP Location: Left Arm, Patient Position: Sitting, Cuff Size: Normal)   Pulse 83   Resp 14  Ht 5' 7.5" (1.715 m)   Wt 147 lb 3.2 oz (66.8 kg)   BMI 22.71 kg/m    Physical Exam Vitals and nursing note reviewed.  Constitutional:      Appearance: She is well-developed.  HENT:     Head: Normocephalic and atraumatic.  Eyes:     Conjunctiva/sclera: Conjunctivae normal.  Cardiovascular:     Rate and Rhythm: Normal rate and regular rhythm.     Heart sounds: Normal  heart sounds.  Pulmonary:     Effort: Pulmonary effort is normal.     Breath sounds: Normal breath sounds.  Abdominal:     General: Bowel sounds are normal.     Palpations: Abdomen is soft.  Musculoskeletal:     Cervical back: Normal range of motion.  Lymphadenopathy:     Cervical: No cervical adenopathy.  Skin:    General: Skin is warm and dry.     Capillary Refill: Capillary refill takes less than 2 seconds.  Neurological:     Mental Status: She is alert and oriented to person, place, and time.  Psychiatric:        Behavior: Behavior normal.      Musculoskeletal Exam: C-spine, thoracic spine, lumbar spine good range of motion.  No midline spinal tenderness.  Shoulder joints, elbow joints, wrist joints, MCPs, PIPs, DIPs good range of motion with no synovitis.  She has complete fist formation bilaterally.  Hip joints have good range of motion with no discomfort.  Knee joints, ankle joints, MTPs, PIPs and DIPs good range of motion with no synovitis.  No warmth or effusion of knee joints noted.  No tenderness or swelling of ankle joints.  No tenderness of MTP joints.  CDAI Exam: CDAI Score: 0  Patient Global: 0 mm; Provider Global: 0 mm Swollen: 0 ; Tender: 0  Joint Exam 09/05/2019   No joint exam has been documented for this visit   There is currently no information documented on the homunculus. Go to the Rheumatology activity and complete the homunculus joint exam.  Investigation: No additional findings.  Imaging: No results found.  Recent Labs: Lab Results  Component Value Date   WBC 6.0 04/24/2019   HGB 16.3 (H) 04/24/2019   PLT 286 04/24/2019   NA 145 04/24/2019   K 4.4 04/24/2019   CL 108 04/24/2019   CO2 26 04/24/2019   GLUCOSE 106 (H) 04/24/2019   BUN 8 04/24/2019   CREATININE 0.87 04/24/2019   BILITOT 0.8 04/24/2019   AST 17 04/24/2019   ALT 15 04/24/2019   PROT 6.6 04/24/2019   CALCIUM 9.6 04/24/2019   GFRAA 84 04/24/2019    Speciality Comments: PLQ  Eye Exam: 12/20/2018 WNL @ [facility] Follow up 1 year  Procedures:  No procedures performed Allergies: Levaquin [levofloxacin in d5w] and Sulfa antibiotics    Assessment / Plan:     Visit Diagnoses: Rheumatoid arthritis involving multiple sites with positive rheumatoid factor (HCC) - RF positive, anti-CCP positive: She has no synovitis or tenderness on exam.  She is not had any recent rheumatoid arthritis flares.  She is clinically doing well on Plaquenil 200 mg 1 tablet by mouth twice daily Monday through Friday.  She has no joint pain or joint swelling at this time.  She is not experiencing any morning stiffness or nocturnal pain.  She has no difficulty with ADLs.  She would like to reduce the dose of Plaquenil since she is clinically been doing well.  She will take Plaquenil  200 mg 1 tablet by mouth daily.  We will change the prescription for future refills.  She was advised to notify us if she develops increased joint pain or joint swelling on the reduced dose of Plaquenil.  She will follow-up in the office in 5 months.  High risk medication use -Plaquenil 200 mg 1 tablet by mouth daily.  She was previously taking Plaquenil 200 mg 1 tablet by mouth twice daily Monday through Friday but she would like to reduce the dose and she is clinically been doing well.  PLQ Eye Exam: 12/20/2018 WNL @ [facility] Follow up 1 year.  CBC and CMP were drawn on 04/24/2019.  She is due to update lab work today.  Orders for CBC and CMP were released. - Plan: CBC with Differential/Platelet, COMPLETE METABOLIC PANEL WITH GFR  Tenosynovitis of right wrist: Resolved.  She has no tenderness or inflammation at this time.  Carpal tunnel syndrome of right wrist: Resolved.  Other medical conditions are listed as follows  Family history of rheumatoid arthritis  Tobacco use  History of kidney stones    Orders: Orders Placed This Encounter  Procedures  . CBC with Differential/Platelet  .  COMPLETE METABOLIC PANEL WITH GFR   No orders of the defined types were placed in this encounter.     Follow-Up Instructions: Return in about 5 months (around 02/05/2020) for Rheumatoid arthritis.   Hazel Sams, PA-C  I examined and evaluated the patient with Hazel Sams PA.  Patient has been doing well on Plaquenil.  She had no synovitis on examination.  She was to reduce the Plaquenil dose.  We discussed decreasing Plaquenil to 200 mg, 1 tablet p.o. daily.  If she develops any symptoms then she can go to her previous dosing.  The plan of care was discussed as noted above.  Bo Merino, MD  Note - This record has been created using Editor, commissioning.  Chart creation errors have been sought, but may not always  have been located. Such creation errors do not reflect on  the standard of medical care.

## 2019-09-05 ENCOUNTER — Other Ambulatory Visit: Payer: Self-pay

## 2019-09-05 ENCOUNTER — Ambulatory Visit (INDEPENDENT_AMBULATORY_CARE_PROVIDER_SITE_OTHER): Payer: 59 | Admitting: Rheumatology

## 2019-09-05 ENCOUNTER — Encounter: Payer: Self-pay | Admitting: Rheumatology

## 2019-09-05 VITALS — BP 146/76 | HR 83 | Resp 14 | Ht 67.5 in | Wt 147.2 lb

## 2019-09-05 DIAGNOSIS — M0579 Rheumatoid arthritis with rheumatoid factor of multiple sites without organ or systems involvement: Secondary | ICD-10-CM

## 2019-09-05 DIAGNOSIS — Z79899 Other long term (current) drug therapy: Secondary | ICD-10-CM | POA: Diagnosis not present

## 2019-09-05 DIAGNOSIS — Z87442 Personal history of urinary calculi: Secondary | ICD-10-CM

## 2019-09-05 DIAGNOSIS — Z8261 Family history of arthritis: Secondary | ICD-10-CM | POA: Diagnosis not present

## 2019-09-05 DIAGNOSIS — M659 Synovitis and tenosynovitis, unspecified: Secondary | ICD-10-CM

## 2019-09-05 DIAGNOSIS — G5601 Carpal tunnel syndrome, right upper limb: Secondary | ICD-10-CM

## 2019-09-05 DIAGNOSIS — Z720 Tobacco use: Secondary | ICD-10-CM

## 2019-09-05 MED ORDER — HYDROXYCHLOROQUINE SULFATE 200 MG PO TABS: 200.0000 mg | ORAL_TABLET | Freq: Every day | ORAL | 0 refills | Status: DC

## 2019-09-05 NOTE — Addendum Note (Signed)
Addended by: Earnestine Mealing on: 09/05/2019 02:43 PM   Modules accepted: Orders

## 2019-09-06 LAB — CBC WITH DIFFERENTIAL/PLATELET
Absolute Monocytes: 480 cells/uL (ref 200–950)
Basophils Absolute: 19 cells/uL (ref 0–200)
Basophils Relative: 0.3 %
Eosinophils Absolute: 51 cells/uL (ref 15–500)
Eosinophils Relative: 0.8 %
HCT: 44.9 % (ref 35.0–45.0)
Hemoglobin: 15 g/dL (ref 11.7–15.5)
Lymphs Abs: 1261 cells/uL (ref 850–3900)
MCH: 32 pg (ref 27.0–33.0)
MCHC: 33.4 g/dL (ref 32.0–36.0)
MCV: 95.7 fL (ref 80.0–100.0)
MPV: 9 fL (ref 7.5–12.5)
Monocytes Relative: 7.5 %
Neutro Abs: 4589 cells/uL (ref 1500–7800)
Neutrophils Relative %: 71.7 %
Platelets: 248 10*3/uL (ref 140–400)
RBC: 4.69 10*6/uL (ref 3.80–5.10)
RDW: 12.4 % (ref 11.0–15.0)
Total Lymphocyte: 19.7 %
WBC: 6.4 10*3/uL (ref 3.8–10.8)

## 2019-09-06 LAB — COMPLETE METABOLIC PANEL WITH GFR
AG Ratio: 2.1 (calc) (ref 1.0–2.5)
ALT: 17 U/L (ref 6–29)
AST: 20 U/L (ref 10–35)
Albumin: 4.2 g/dL (ref 3.6–5.1)
Alkaline phosphatase (APISO): 75 U/L (ref 37–153)
BUN: 11 mg/dL (ref 7–25)
CO2: 25 mmol/L (ref 20–32)
Calcium: 9.3 mg/dL (ref 8.6–10.4)
Chloride: 108 mmol/L (ref 98–110)
Creat: 0.84 mg/dL (ref 0.50–0.99)
GFR, Est African American: 88 mL/min/{1.73_m2} (ref >=60)
GFR, Est Non African American: 76 mL/min/{1.73_m2} (ref >=60)
Globulin: 2 g/dL (calc) (ref 1.9–3.7)
Glucose, Bld: 88 mg/dL (ref 65–99)
Potassium: 4.3 mmol/L (ref 3.5–5.3)
Sodium: 143 mmol/L (ref 135–146)
Total Bilirubin: 0.5 mg/dL (ref 0.2–1.2)
Total Protein: 6.2 g/dL (ref 6.1–8.1)

## 2019-09-06 NOTE — Progress Notes (Signed)
CBC and CMP WNL

## 2019-10-24 ENCOUNTER — Other Ambulatory Visit: Payer: Self-pay | Admitting: Rheumatology

## 2019-10-24 DIAGNOSIS — M0579 Rheumatoid arthritis with rheumatoid factor of multiple sites without organ or systems involvement: Secondary | ICD-10-CM

## 2019-10-24 NOTE — Telephone Encounter (Signed)
Okay to reduce Plaquenil to 200 mg 1 p.o. daily.

## 2019-10-24 NOTE — Telephone Encounter (Signed)
Verified verbal prescription with Dr. Estanislado Pandy Plaquenil 200 mg Sig: Take one tablet by mouth daily. #90 with no refills.

## 2019-10-24 NOTE — Telephone Encounter (Addendum)
Last Visit: 09/05/2019 Next Visit: 02/06/2020 Labs: 09/05/2019 WNL PLQ Eye Exam: 12/20/2018 WNL  Current Dose per office note 09/05/2019:  Plaquenil 200 mg 1 tablet by mouth twice daily Monday through Friday.She would like to reduce the dose of Plaquenil since she is clinically been doing well.  She will take Plaquenil 200 mg 1 tablet by mouth daily.  DX:  Rheumatoid arthritis   Okay to refill PLQ?

## 2019-12-24 ENCOUNTER — Encounter: Payer: Self-pay | Admitting: Rheumatology

## 2020-01-23 NOTE — Progress Notes (Signed)
Office Visit Note  Patient: Amber Sampson             Date of Birth: [birth year]           MRN: [id number]             PCP: Mayra Neer, MD Referring: Mayra Neer, MD Visit Date: 02/06/2020 Occupation: @GUAROCC @  Subjective:  Medication management   History of Present Illness: Amber Sampson is a 59 y.o. female with history of rheumatoid arthritis.  She has been tolerating Plaquenil well.  She is taking 1 tablet p.o. daily.  She denies any joint pain or joint swelling.  Its been a while since she had a flare.  Activities of Daily Living:  Patient reports morning stiffness for 0 none.   Patient Denies nocturnal pain.  Difficulty dressing/grooming: Denies Difficulty climbing stairs: Denies Difficulty getting out of chair: Denies Difficulty using hands for taps, buttons, cutlery, and/or writing: Denies  Review of Systems  Constitutional: Negative for fatigue.  HENT: Negative for mouth dryness.   Eyes: Negative for dryness.  Respiratory: Negative for shortness of breath.   Cardiovascular: Negative for swelling in legs/feet.  Gastrointestinal: Negative for constipation.  Endocrine: Positive for heat intolerance.  Genitourinary: Negative for difficulty urinating.  Musculoskeletal: Negative for arthralgias and joint pain.  Skin: Negative for rash.  Allergic/Immunologic: Negative for susceptible to infections.  Neurological: Negative for weakness.  Hematological: Positive for bruising/bleeding tendency.  Psychiatric/Behavioral: Positive for sleep disturbance.    PMFS History:  Patient Active Problem List   Diagnosis Date Noted  . Rheumatoid arthritis involving multiple sites with positive rheumatoid factor ([place]) 11/09/2017  . History of kidney stones 10/03/2017  . Family history of rheumatoid arthritis 10/03/2017    Past Medical History:  Diagnosis Date  . Carpal tunnel syndrome   . Kidney stones   . Rheumatoid arthritis ([place])     Family History  Problem  Relation Age of Onset  . Heart Problems Mother        pacemaker   . Arthritis Father   . Dementia Sister   . Healthy Daughter   . Healthy Daughter   . Healthy Son    Past Surgical History:  Procedure Laterality Date  . CESAREAN SECTION    . CYSTOSCOPY  2018  . DILATION AND CURETTAGE OF UTERUS     x6  . HERNIA REPAIR    . UTERINE SEPTUM RESECTION  1990s   Social History   Social History Narrative  . Not on file   Immunization History  Administered Date(s) Administered  . Influenza-Unspecified 01/31/2017  . PFIZER SARS-COV-2 Vaccination 07/20/2019, 08/10/2019  . Pneumococcal Conjugate-13 03/11/2018     Objective: Vital Signs: BP 131/82 (BP Location: Left Arm, Patient Position: Sitting, Cuff Size: Normal)   Pulse 86   Resp 16   Ht 5' 7.5" (1.715 m)   Wt 151 lb 6.4 oz (68.7 kg)   BMI 23.36 kg/m    Physical Exam Vitals and nursing note reviewed.  Constitutional:      Appearance: She is well-developed.  HENT:     Head: Normocephalic and atraumatic.  Eyes:     Conjunctiva/sclera: Conjunctivae normal.  Cardiovascular:     Rate and Rhythm: Normal rate and regular rhythm.     Heart sounds: Normal heart sounds.  Pulmonary:     Effort: Pulmonary effort is normal.     Breath sounds: Normal breath sounds.  Abdominal:     General: Bowel sounds are normal.  Palpations: Abdomen is soft.  Musculoskeletal:     Cervical back: Normal range of motion.  Lymphadenopathy:     Cervical: No cervical adenopathy.  Skin:    General: Skin is warm and dry.     Capillary Refill: Capillary refill takes less than 2 seconds.  Neurological:     Mental Status: She is alert and oriented to person, place, and time.  Psychiatric:        Behavior: Behavior normal.      Musculoskeletal Exam: C-spine thoracic and lumbar spine range of motion.  Shoulder joints, elbow joints, wrist joints, MCPs PIPs and DIPs been good range of motion with no synovitis.  Hip joints, knee joints, ankles,  MTPs and PIPs with good range of motion with no synovitis.  She has some hypermobility in her joints.  CDAI Exam: CDAI Score: 0  Patient Global: 0 mm; Provider Global: 0 mm Swollen: 0 ; Tender: 0  Joint Exam 02/06/2020   No joint exam has been documented for this visit   There is currently no information documented on the homunculus. Go to the Rheumatology activity and complete the homunculus joint exam.  Investigation: No additional findings.  Imaging: No results found.  Recent Labs: Lab Results  Component Value Date   WBC 6.4 09/05/2019   HGB 15.0 09/05/2019   PLT 248 09/05/2019   NA 143 09/05/2019   K 4.3 09/05/2019   CL 108 09/05/2019   CO2 25 09/05/2019   GLUCOSE 88 09/05/2019   BUN 11 09/05/2019   CREATININE 0.84 09/05/2019   BILITOT 0.5 09/05/2019   AST 20 09/05/2019   ALT 17 09/05/2019   PROT 6.2 09/05/2019   CALCIUM 9.3 09/05/2019   GFRAA 88 09/05/2019    Speciality Comments: PLQ Eye Exam: 12/24/2019 WNL @ [facility] Follow up 1 year  Procedures:  No procedures performed Allergies: Levaquin [levofloxacin in d5w] and Sulfa antibiotics   Assessment / Plan:     Visit Diagnoses: Rheumatoid arthritis involving multiple sites with positive rheumatoid factor (HCC) - RF positive, anti-CCP positive: She has been doing well on low-dose Plaquenil.  She had no synovitis on examination today.  High risk medication use - Plaquenil 200 mg 1 tablet by mouth daily. PLQ Eye Exam: 12/24/2019 - Plan: CBC with Differential/Platelet, COMPLETE METABOLIC PANEL WITH GFR today and then every 5 months to monitor for drug toxicity.  Family history of rheumatoid arthritis  Carpal tunnel syndrome of right wrist - Resolved.  History of kidney stones  Tobacco use-detailed counseling guarding smoking cessation was provided.  She should also get a screening CT scan to rule out malignancy as she has been a smoker for more than 40 years.  Have advised her to discuss this further  with Dr. Brigitte Pulse.  Association of smoking with rheumatoid arthritis was also discussed.  Educated about COVID-19 virus infection-she is fully immunized against COVID-19.  She has been advised to get a booster.  Use of mask, social distancing and hand hygiene was discussed.  I also discussed the use of monoclonal antibody infusion in case she gets COVID-19 infection.  Orders: Orders Placed This Encounter  Procedures  . CBC with Differential/Platelet  . COMPLETE METABOLIC PANEL WITH GFR   No orders of the defined types were placed in this encounter.     Follow-Up Instructions: Return in about 5 months (around 07/06/2020) for Rheumatoid arthritis.   Bo Merino, MD  Note - This record has been created using Editor, commissioning.  Chart creation errors  have been sought, but may not always  have been located. Such creation errors do not reflect on  the standard of medical care.

## 2020-02-06 ENCOUNTER — Encounter: Payer: Self-pay | Admitting: Rheumatology

## 2020-02-06 ENCOUNTER — Ambulatory Visit (INDEPENDENT_AMBULATORY_CARE_PROVIDER_SITE_OTHER): Payer: 59 | Admitting: Rheumatology

## 2020-02-06 ENCOUNTER — Other Ambulatory Visit: Payer: Self-pay

## 2020-02-06 VITALS — BP 131/82 | HR 86 | Resp 16 | Ht 67.5 in | Wt 151.4 lb

## 2020-02-06 DIAGNOSIS — Z7189 Other specified counseling: Secondary | ICD-10-CM

## 2020-02-06 DIAGNOSIS — Z79899 Other long term (current) drug therapy: Secondary | ICD-10-CM | POA: Diagnosis not present

## 2020-02-06 DIAGNOSIS — G5601 Carpal tunnel syndrome, right upper limb: Secondary | ICD-10-CM | POA: Diagnosis not present

## 2020-02-06 DIAGNOSIS — M0579 Rheumatoid arthritis with rheumatoid factor of multiple sites without organ or systems involvement: Secondary | ICD-10-CM | POA: Diagnosis not present

## 2020-02-06 DIAGNOSIS — Z87442 Personal history of urinary calculi: Secondary | ICD-10-CM

## 2020-02-06 DIAGNOSIS — Z8261 Family history of arthritis: Secondary | ICD-10-CM

## 2020-02-06 DIAGNOSIS — Z720 Tobacco use: Secondary | ICD-10-CM

## 2020-02-06 NOTE — Patient Instructions (Signed)
COVID-19 vaccine recommendations:   COVID-19 vaccine is recommended for everyone (unless you are allergic to a vaccine component), even if you are on a medication that suppresses your immune system.   Do not take Tylenol or any anti-inflammatory medications (NSAIDs) 24 hours prior to the COVID-19 vaccination.   There is no direct evidence about the efficacy of the COVID-19 vaccine in individuals who are on medications that suppress the immune system.   Even if you are fully vaccinated, and you are on any medications that suppress your immune system, please continue to wear a mask, maintain at least six feet social distance and practice hand hygiene.   If you develop a COVID-19 infection, please contact your PCP or our office to determine if you need antibody infusion.  The booster vaccine is now available for immunocompromised patients. It is advised that if you had Pfizer vaccine you should get Pfizer booster.  If you had a Moderna vaccine then you should get a Moderna booster. [employer] does not have a booster vaccine at this time.  Please see the following web sites for updated information.   [email or web address]-Vaccination-Patient-Resources.[email or web address]  [email or web address]://[email or web address]/About-Us/[email or web address]-Releases/ID/1159  

## 2020-02-07 LAB — CBC WITH DIFFERENTIAL/PLATELET
Absolute Monocytes: 446 cells/uL (ref 200–950)
Basophils Absolute: 19 cells/uL (ref 0–200)
Basophils Relative: 0.4 %
Eosinophils Absolute: 58 cells/uL (ref 15–500)
Eosinophils Relative: 1.2 %
HCT: 46.6 % — ABNORMAL HIGH (ref 35.0–45.0)
Hemoglobin: 15.8 g/dL — ABNORMAL HIGH (ref 11.7–15.5)
Lymphs Abs: 1608 cells/uL (ref 850–3900)
MCH: 32.2 pg (ref 27.0–33.0)
MCHC: 33.9 g/dL (ref 32.0–36.0)
MCV: 95.1 fL (ref 80.0–100.0)
MPV: 9.2 fL (ref 7.5–12.5)
Monocytes Relative: 9.3 %
Neutro Abs: 2669 cells/uL (ref 1500–7800)
Neutrophils Relative %: 55.6 %
Platelets: 261 10*3/uL (ref 140–400)
RBC: 4.9 10*6/uL (ref 3.80–5.10)
RDW: 11.9 % (ref 11.0–15.0)
Total Lymphocyte: 33.5 %
WBC: 4.8 10*3/uL (ref 3.8–10.8)

## 2020-02-07 LAB — COMPLETE METABOLIC PANEL WITH GFR
AG Ratio: 2.2 (calc) (ref 1.0–2.5)
ALT: 18 U/L (ref 6–29)
AST: 21 U/L (ref 10–35)
Albumin: 4.6 g/dL (ref 3.6–5.1)
Alkaline phosphatase (APISO): 81 U/L (ref 37–153)
BUN: 8 mg/dL (ref 7–25)
CO2: 30 mmol/L (ref 20–32)
Calcium: 9.7 mg/dL (ref 8.6–10.4)
Chloride: 105 mmol/L (ref 98–110)
Creat: 0.8 mg/dL (ref 0.50–0.99)
GFR, Est African American: 93 mL/min/{1.73_m2} (ref >=60)
GFR, Est Non African American: 80 mL/min/{1.73_m2} (ref >=60)
Globulin: 2.1 g/dL (calc) (ref 1.9–3.7)
Glucose, Bld: 86 mg/dL (ref 65–99)
Potassium: 5.1 mmol/L (ref 3.5–5.3)
Sodium: 142 mmol/L (ref 135–146)
Total Bilirubin: 0.8 mg/dL (ref 0.2–1.2)
Total Protein: 6.7 g/dL (ref 6.1–8.1)

## 2020-03-30 ENCOUNTER — Other Ambulatory Visit: Payer: Self-pay | Admitting: Rheumatology

## 2020-03-30 DIAGNOSIS — M0579 Rheumatoid arthritis with rheumatoid factor of multiple sites without organ or systems involvement: Secondary | ICD-10-CM

## 2020-03-31 NOTE — Telephone Encounter (Signed)
Last Visit: 02/06/2020 Next Visit: 07/07/2020 Labs: 02/06/2020 Hemoglobin and hematocrit are borderline elevated but stable. Rest of CBC WNL. CMP WNL.  Eye exam: 12/24/2019 WNL   Current Dose per office note 02/06/2020: Plaquenil 200 mg 1 tablet by mouth daily DX:  Rheumatoid arthritis involving multiple sites with positive rheumatoid factor   Okay to refill Plaquenil?

## 2020-06-23 NOTE — Progress Notes (Signed)
Office Visit Note  Patient: Amber Sampson             Date of Birth: 1960-12-19           MRN: [id number]             PCP: Mayra Neer, MD Referring: Mayra Neer, MD Visit Date: 07/07/2020 Occupation: @GUAROCC @  Subjective:  Medication monitoring   History of Present Illness: Amber Sampson is a 60 y.o. female with history of seropositive rheumatoid arthritis. She is taking plaquenil 200 mg 1 tablet by mouth daily.  She is tolerating plaquenil without any side effects.  She has not missed any doses recently.  She denies any recent rheumatoid arthritis flares. She denies any joint pain or joint swelling.  She denies any morning stiffness or nocturnal pain.  She has not had any difficulty with ADLs.  She continues to take tart cherry, ginger, and tumeric on a daily basis.  She denies any recent infections. She takes cats claw to boost her immunity.     Activities of Daily Living:  Patient reports morning stiffness for 0 minutes.   Patient Denies nocturnal pain.  Difficulty dressing/grooming: Denies Difficulty climbing stairs: Denies Difficulty getting out of chair: Denies Difficulty using hands for taps, buttons, cutlery, and/or writing: Denies  Review of Systems  Constitutional: Negative for fatigue.  HENT: Negative for mouth sores, mouth dryness and nose dryness.   Eyes: Negative for pain, itching and dryness.  Respiratory: Negative for shortness of breath and difficulty breathing.   Cardiovascular: Negative for chest pain and palpitations.  Gastrointestinal: Negative for blood in stool, constipation and diarrhea.  Endocrine: Negative for increased urination.  Genitourinary: Negative for difficulty urinating.  Musculoskeletal: Negative for arthralgias, joint pain, joint swelling, myalgias, morning stiffness, muscle tenderness and myalgias.  Skin: Negative for color change, rash and redness.  Allergic/Immunologic: Negative for susceptible to infections.   Neurological: Positive for headaches. Negative for dizziness, numbness, memory loss and weakness.  Hematological: Positive for bruising/bleeding tendency.  Psychiatric/Behavioral: Negative for confusion.    PMFS History:  Patient Active Problem List   Diagnosis Date Noted  . Rheumatoid arthritis involving multiple sites with positive rheumatoid factor ([place]) 11/09/2017  . History of kidney stones 10/03/2017  . Family history of rheumatoid arthritis 10/03/2017    Past Medical History:  Diagnosis Date  . Carpal tunnel syndrome   . Kidney stones   . Rheumatoid arthritis ([place])     Family History  Problem Relation Age of Onset  . Heart Problems Mother        pacemaker   . Arthritis Father   . Dementia Sister   . Healthy Daughter   . Healthy Daughter   . Healthy Son    Past Surgical History:  Procedure Laterality Date  . CESAREAN SECTION    . CYSTOSCOPY  2018  . DILATION AND CURETTAGE OF UTERUS     x6  . HERNIA REPAIR    . UTERINE SEPTUM RESECTION  1990s   Social History   Social History Narrative  . Not on file   Immunization History  Administered Date(s) Administered  . Influenza-Unspecified 01/31/2017  . PFIZER(Purple Top)SARS-COV-2 Vaccination 07/20/2019, 08/10/2019, 02/22/2020  . Pneumococcal Conjugate-13 03/11/2018     Objective: Vital Signs: BP (!) 154/97 (BP Location: Left Arm, Patient Position: Sitting, Cuff Size: Normal)   Pulse 84   Resp 14   Ht 5' 7.5" (1.715 m)   Wt 164 lb (74.4 kg)   BMI 25.31 kg/m  Physical Exam Vitals and nursing note reviewed.  Constitutional:      Appearance: She is well-developed and well-nourished.  HENT:     Head: Normocephalic and atraumatic.  Eyes:     Extraocular Movements: EOM normal.     Conjunctiva/sclera: Conjunctivae normal.  Cardiovascular:     Pulses: Intact distal pulses.  Pulmonary:     Effort: Pulmonary effort is normal.  Abdominal:     Palpations: Abdomen is soft.  Musculoskeletal:     Cervical  back: Normal range of motion.  Skin:    General: Skin is warm and dry.     Capillary Refill: Capillary refill takes less than 2 seconds.  Neurological:     Mental Status: She is alert and oriented to person, place, and time.  Psychiatric:        Mood and Affect: Mood and affect normal.        Behavior: Behavior normal.      Musculoskeletal Exam: C-spine, thoracic spine, lumbar spine good range of motion with no discomfort.  No midline spinal tenderness or SI joint tenderness.  Shoulder joints, joints, wrist joints, MCPs, PIPs, DIPs good range of motion with no synovitis.  She is able to make a complete fist bilaterally.  Hip joints have good range of motion with no discomfort.  No tenderness palpation over trochanteric bursa bilaterally.  Knee joints have good range of motion with no warmth or effusion.  Ankle joints have good range of motion with no tenderness or inflammation.  No tenderness along the Achilles tendon or plantar fascial.  No tenderness of MTP joints.  No calf tenderness or tightness bilaterally.   CDAI Exam: CDAI Score: 0  Patient Global: 0 mm; Provider Global: 0 mm Swollen: 0 ; Tender: 0  Joint Exam 07/07/2020   No joint exam has been documented for this visit   There is currently no information documented on the homunculus. Go to the Rheumatology activity and complete the homunculus joint exam.  Investigation: No additional findings.  Imaging: No results found.  Recent Labs: Lab Results  Component Value Date   WBC 4.8 02/06/2020   HGB 15.8 (H) 02/06/2020   PLT 261 02/06/2020   NA 142 02/06/2020   K 5.1 02/06/2020   CL 105 02/06/2020   CO2 30 02/06/2020   GLUCOSE 86 02/06/2020   BUN 8 02/06/2020   CREATININE 0.80 02/06/2020   BILITOT 0.8 02/06/2020   AST 21 02/06/2020   ALT 18 02/06/2020   PROT 6.7 02/06/2020   CALCIUM 9.7 02/06/2020   GFRAA 93 02/06/2020    Speciality Comments: PLQ Eye Exam: 12/24/2019 WNL @ [facility] Follow up 1  year  Procedures:  No procedures performed Allergies: Levaquin [levofloxacin in d5w] and Sulfa antibiotics   Assessment / Plan:     Visit Diagnoses: Rheumatoid arthritis involving multiple sites with positive rheumatoid factor (HCC) - RF positive, anti-CCP positive: She has no joint tenderness or synovitis on exam.  She has not had any recent rheumatoid arthritis flares.  She is clinically doing well taking Plaquenil 200 mg 1 tablet by mouth daily.  She is tolerating Plaquenil without any side effects and has not missed any doses recently.  She has not been experiencing any morning stiffness, nocturnal pain, or difficulty with ADLs.  She will continue taking Plaquenil as prescribed.  She does not need any refills at this time.  She was advised to notify us if she develops increased joint pain or joint swelling.  She will  follow-up in the office in 5 months.  High risk medication use -  Plaquenil 200 mg 1 tablet by mouth daily. PLQ Eye Exam: 12/24/2019 WNL @ [facility] Follow up 1 year.  Lab work from 02/06/2020 was reviewed with the patient today in the office.  She is due to update lab work.  Orders for CBC and CMP were released.  She will continue to require updated lab work every 5 months.    - Plan: CBC with Differential/Platelet, COMPLETE METABOLIC PANEL WITH GFR She has not had any recent infections.  She takes cats claw to boost her immunity.   Tenosynovitis of right wrist: Resolved.  No recurrence.  She has no tenderness or inflammation on exam.   Other medical conditions are listed as follows:   Family history of rheumatoid arthritis  Carpal tunnel syndrome of right wrist: Asymptomatic at this time.   History of kidney stones  Tobacco use  Orders: Orders Placed This Encounter  Procedures  . CBC with Differential/Platelet  . COMPLETE METABOLIC PANEL WITH GFR   No orders of the defined types were placed in this encounter.   Follow-Up Instructions: Return in about 5  months (around 12/07/2020) for Rheumatoid arthritis.   Ofilia Neas, PA-C  Note - This record has been created using Dragon software.  Chart creation errors have been sought, but may not always  have been located. Such creation errors do not reflect on  the standard of medical care.

## 2020-06-25 ENCOUNTER — Other Ambulatory Visit: Payer: Self-pay | Admitting: Physician Assistant

## 2020-06-25 DIAGNOSIS — M0579 Rheumatoid arthritis with rheumatoid factor of multiple sites without organ or systems involvement: Secondary | ICD-10-CM

## 2020-06-25 DIAGNOSIS — Z79899 Other long term (current) drug therapy: Secondary | ICD-10-CM

## 2020-06-25 NOTE — Telephone Encounter (Signed)
Last Visit: 02/06/2020 Next Visit: 07/07/2020 Labs: 02/06/2020, Hemoglobin and hematocrit are borderline elevated but stable. Rest of CBC WNL. CMP WNL. LMOM for patient labs are due. Eye exam: 12/24/2019  Current Dose per office note 02/06/2020, Plaquenil 200 mg 1 tablet by mouth daily. DX: Rheumatoid arthritis involving multiple sites with positive rheumatoid factor   Last Fill: 03/31/2020  Okay to refill Plaquenil?

## 2020-07-07 ENCOUNTER — Other Ambulatory Visit: Payer: Self-pay

## 2020-07-07 ENCOUNTER — Encounter: Payer: Self-pay | Admitting: Physician Assistant

## 2020-07-07 ENCOUNTER — Ambulatory Visit (INDEPENDENT_AMBULATORY_CARE_PROVIDER_SITE_OTHER): Payer: 59 | Admitting: Physician Assistant

## 2020-07-07 VITALS — BP 154/97 | HR 84 | Resp 14 | Ht 67.5 in | Wt 164.0 lb

## 2020-07-07 DIAGNOSIS — Z79899 Other long term (current) drug therapy: Secondary | ICD-10-CM | POA: Diagnosis not present

## 2020-07-07 DIAGNOSIS — Z8261 Family history of arthritis: Secondary | ICD-10-CM

## 2020-07-07 DIAGNOSIS — M0579 Rheumatoid arthritis with rheumatoid factor of multiple sites without organ or systems involvement: Secondary | ICD-10-CM | POA: Diagnosis not present

## 2020-07-07 DIAGNOSIS — M659 Synovitis and tenosynovitis, unspecified: Secondary | ICD-10-CM

## 2020-07-07 DIAGNOSIS — Z720 Tobacco use: Secondary | ICD-10-CM

## 2020-07-07 DIAGNOSIS — G5601 Carpal tunnel syndrome, right upper limb: Secondary | ICD-10-CM

## 2020-07-07 DIAGNOSIS — Z87442 Personal history of urinary calculi: Secondary | ICD-10-CM

## 2020-07-08 LAB — COMPLETE METABOLIC PANEL WITH GFR
AG Ratio: 2.2 (calc) (ref 1.0–2.5)
ALT: 14 U/L (ref 6–29)
AST: 19 U/L (ref 10–35)
Albumin: 4.4 g/dL (ref 3.6–5.1)
Alkaline phosphatase (APISO): 75 U/L (ref 37–153)
BUN: 12 mg/dL (ref 7–25)
CO2: 30 mmol/L (ref 20–32)
Calcium: 9.9 mg/dL (ref 8.6–10.4)
Chloride: 106 mmol/L (ref 98–110)
Creat: 0.89 mg/dL (ref 0.50–0.99)
GFR, Est African American: 81 mL/min/{1.73_m2} (ref >=60)
GFR, Est Non African American: 70 mL/min/{1.73_m2} (ref >=60)
Globulin: 2 g/dL (calc) (ref 1.9–3.7)
Glucose, Bld: 78 mg/dL (ref 65–99)
Potassium: 4.8 mmol/L (ref 3.5–5.3)
Sodium: 143 mmol/L (ref 135–146)
Total Bilirubin: 0.6 mg/dL (ref 0.2–1.2)
Total Protein: 6.4 g/dL (ref 6.1–8.1)

## 2020-07-08 LAB — CBC WITH DIFFERENTIAL/PLATELET
Absolute Monocytes: 525 cells/uL (ref 200–950)
Basophils Absolute: 20 cells/uL (ref 0–200)
Basophils Relative: 0.4 %
Eosinophils Absolute: 61 cells/uL (ref 15–500)
Eosinophils Relative: 1.2 %
HCT: 45 % (ref 35.0–45.0)
Hemoglobin: 15.2 g/dL (ref 11.7–15.5)
Lymphs Abs: 1423 cells/uL (ref 850–3900)
MCH: 31.8 pg (ref 27.0–33.0)
MCHC: 33.8 g/dL (ref 32.0–36.0)
MCV: 94.1 fL (ref 80.0–100.0)
MPV: 9.1 fL (ref 7.5–12.5)
Monocytes Relative: 10.3 %
Neutro Abs: 3070 cells/uL (ref 1500–7800)
Neutrophils Relative %: 60.2 %
Platelets: 251 10*3/uL (ref 140–400)
RBC: 4.78 10*6/uL (ref 3.80–5.10)
RDW: 11.8 % (ref 11.0–15.0)
Total Lymphocyte: 27.9 %
WBC: 5.1 10*3/uL (ref 3.8–10.8)

## 2020-07-08 NOTE — Progress Notes (Signed)
CBC and CMP WNL

## 2020-09-23 ENCOUNTER — Other Ambulatory Visit: Payer: Self-pay | Admitting: Physician Assistant

## 2020-09-23 DIAGNOSIS — M0579 Rheumatoid arthritis with rheumatoid factor of multiple sites without organ or systems involvement: Secondary | ICD-10-CM

## 2020-09-23 DIAGNOSIS — Z79899 Other long term (current) drug therapy: Secondary | ICD-10-CM

## 2020-09-23 NOTE — Telephone Encounter (Signed)
Last Visit: 07/07/2020 Next Visit: 12/09/2020 Labs: 07/07/2020 CBC and CMP WNL.  Eye exam:  12/24/2019  Current Dose per office note on 07/07/2020: Plaquenil 200 mg 1 tablet by mouth daily ON:[id number] arthritis involving multiple sites with positive rheumatoid factor  Last Fill: 06/25/2020  Okay to refill Plaquenil?

## 2020-11-25 NOTE — Progress Notes (Signed)
Office Visit Note  Patient: Amber Sampson             Date of Birth: 07-12-1960           MRN: [id number]             PCP: Mayra Neer, MD Referring: Mayra Neer, MD Visit Date: 12/09/2020 Occupation: '@GUAROCC'$ @  Subjective:  Medication management.   History of Present Illness: Amber Sampson is a 60 y.o. female with history of seropositive rheumatoid arthritis.  She has been doing well on hydroxychloroquine 200 mg p.o. daily.  She states the right wrist joint swelling completely resolved.  The symptoms of carpal tunnel syndrome also resolved.  None of the other joints are painful.  Activities of Daily Living:  Patient reports morning stiffness for 0 minutes.   Patient Denies nocturnal pain.  Difficulty dressing/grooming: Denies Difficulty climbing stairs: Denies Difficulty getting out of chair: Denies Difficulty using hands for taps, buttons, cutlery, and/or writing: Denies  Review of Systems  Constitutional:  Positive for fatigue.  HENT:  Negative for mouth sores, mouth dryness and nose dryness.   Eyes:  Negative for pain, itching and dryness.  Respiratory:  Positive for shortness of breath. Negative for difficulty breathing.   Cardiovascular:  Positive for palpitations. Negative for chest pain.  Gastrointestinal:  Negative for blood in stool, constipation and diarrhea.  Endocrine: Negative for increased urination.  Genitourinary:  Negative for difficulty urinating.  Musculoskeletal:  Negative for joint pain, joint pain, joint swelling, myalgias, morning stiffness, muscle tenderness and myalgias.  Skin:  Negative for color change, rash and redness.  Allergic/Immunologic: Negative for susceptible to infections.  Neurological:  Positive for headaches. Negative for dizziness, numbness, memory loss and weakness.  Hematological:  Positive for bruising/bleeding tendency.  Psychiatric/Behavioral:  Negative for confusion.    PMFS History:  Patient Active Problem List    Diagnosis Date Noted   Rheumatoid arthritis involving multiple sites with positive rheumatoid factor ([place]) 11/09/2017   History of kidney stones 10/03/2017   Family history of rheumatoid arthritis 10/03/2017    Past Medical History:  Diagnosis Date   Carpal tunnel syndrome    Kidney stones    Rheumatoid arthritis ([place])     Family History  Problem Relation Age of Onset   Heart Problems Mother        pacemaker    Arthritis Father    Dementia Sister    Healthy Daughter    Healthy Daughter    Healthy Son    Past Surgical History:  Procedure Laterality Date   CESAREAN SECTION     CYSTOSCOPY  2018   DILATION AND CURETTAGE OF UTERUS     x6   HERNIA REPAIR     UTERINE SEPTUM RESECTION  1990s   Social History   Social History Narrative   Not on file   Immunization History  Administered Date(s) Administered   Influenza-Unspecified 01/31/2017   PFIZER(Purple Top)SARS-COV-2 Vaccination 07/20/2019, 08/10/2019, 02/22/2020   Pneumococcal Conjugate-13 03/11/2018     Objective: Vital Signs: BP 120/87 (BP Location: Left Arm, Patient Position: Sitting, Cuff Size: Normal)   Pulse 96   Resp 16   Ht 5' 7.5" (1.715 m)   Wt 163 lb 6.4 oz (74.1 kg)   BMI 25.21 kg/m    Physical Exam Vitals and nursing note reviewed.  Constitutional:      Appearance: She is well-developed.  HENT:     Head: Normocephalic and atraumatic.  Eyes:     Conjunctiva/sclera:  Conjunctivae normal.  Cardiovascular:     Rate and Rhythm: Normal rate and regular rhythm.     Heart sounds: Normal heart sounds.  Pulmonary:     Effort: Pulmonary effort is normal.     Breath sounds: Normal breath sounds.  Abdominal:     General: Bowel sounds are normal.     Palpations: Abdomen is soft.  Musculoskeletal:     Cervical back: Normal range of motion.  Lymphadenopathy:     Cervical: No cervical adenopathy.  Skin:    General: Skin is warm and dry.     Capillary Refill: Capillary refill takes less than 2  seconds.  Neurological:     Mental Status: She is alert and oriented to person, place, and time.  Psychiatric:        Behavior: Behavior normal.     Musculoskeletal Exam: C-spine was in good range of motion.  Shoulder joints, elbow joints, wrist joints, MCPs PIPs and DIPs with good range of motion with no synovitis.  Hip joints, knee joints, ankles, MTPs and PIPs with good range of motion without synovitis.  CDAI Exam: CDAI Score: 0  Patient Global: 0 mm; Provider Global: 0 mm Swollen: 0 ; Tender: 0  Joint Exam 12/09/2020   No joint exam has been documented for this visit   There is currently no information documented on the homunculus. Go to the Rheumatology activity and complete the homunculus joint exam.  Investigation: No additional findings.  Imaging: No results found.  Recent Labs: Lab Results  Component Value Date   WBC 5.1 07/07/2020   HGB 15.2 07/07/2020   PLT 251 07/07/2020   NA 143 07/07/2020   K 4.8 07/07/2020   CL 106 07/07/2020   CO2 30 07/07/2020   GLUCOSE 78 07/07/2020   BUN 12 07/07/2020   CREATININE 0.89 07/07/2020   BILITOT 0.6 07/07/2020   AST 19 07/07/2020   ALT 14 07/07/2020   PROT 6.4 07/07/2020   CALCIUM 9.9 07/07/2020   GFRAA 81 07/07/2020    Speciality Comments: PLQ Eye Exam: 12/24/2019 WNL @ [facility] Follow up 1 year  Procedures:  No procedures performed Allergies: Levaquin [levofloxacin in d5w] and Sulfa antibiotics   Assessment / Plan:     Visit Diagnoses: Rheumatoid arthritis involving multiple sites with positive rheumatoid factor (HCC) - RF positive, anti-CCP positive: She is doing well with no synovitis on examination today.  High risk medication use - Plaquenil 200 mg 1 tablet by mouth daily. PLQ Eye Exam: 12/24/2019 WNL - Plan: CBC with Differential/Platelet, COMPLETE METABOLIC PANEL WITH GFR today and then every 5 months.  She has a schedule an eye examination this month.  Updated information on immunization was  provided and placed in the AVS.  Increased risk of heart disease with related arthritis was also discussed.  Dietary modifications and exercise was discussed.  Tenosynovitis of right wrist - Resolved.  She denies any joint swelling.  Right cubital syndrome also resolved.  Family history of rheumatoid arthritis  History of kidney stones  Tobacco use-smoking cessation was discussed with the patient.  Orders: Orders Placed This Encounter  Procedures   CBC with Differential/Platelet   COMPLETE METABOLIC PANEL WITH GFR   No orders of the defined types were placed in this encounter.    Follow-Up Instructions: Return in about 5 months (around 05/11/2021) for Rheumatoid arthritis.   Bo Merino, MD  Note - This record has been created using Editor, commissioning.  Chart creation errors have been sought,  but may not always  have been located. Such creation errors do not reflect on  the standard of medical care.

## 2020-12-09 ENCOUNTER — Ambulatory Visit (INDEPENDENT_AMBULATORY_CARE_PROVIDER_SITE_OTHER): Payer: 59 | Admitting: Rheumatology

## 2020-12-09 ENCOUNTER — Encounter: Payer: Self-pay | Admitting: Rheumatology

## 2020-12-09 ENCOUNTER — Other Ambulatory Visit: Payer: Self-pay

## 2020-12-09 VITALS — BP 120/87 | HR 96 | Resp 16 | Ht 67.5 in | Wt 163.4 lb

## 2020-12-09 DIAGNOSIS — Z8261 Family history of arthritis: Secondary | ICD-10-CM | POA: Diagnosis not present

## 2020-12-09 DIAGNOSIS — G5601 Carpal tunnel syndrome, right upper limb: Secondary | ICD-10-CM

## 2020-12-09 DIAGNOSIS — M659 Synovitis and tenosynovitis, unspecified: Secondary | ICD-10-CM | POA: Diagnosis not present

## 2020-12-09 DIAGNOSIS — Z79899 Other long term (current) drug therapy: Secondary | ICD-10-CM

## 2020-12-09 DIAGNOSIS — Z720 Tobacco use: Secondary | ICD-10-CM

## 2020-12-09 DIAGNOSIS — Z87442 Personal history of urinary calculi: Secondary | ICD-10-CM

## 2020-12-09 DIAGNOSIS — M0579 Rheumatoid arthritis with rheumatoid factor of multiple sites without organ or systems involvement: Secondary | ICD-10-CM

## 2020-12-09 LAB — CBC WITH DIFFERENTIAL/PLATELET
Absolute Monocytes: 540 cells/uL (ref 200–950)
Basophils Absolute: 30 cells/uL (ref 0–200)
Basophils Relative: 0.6 %
Eosinophils Absolute: 150 cells/uL (ref 15–500)
Eosinophils Relative: 3 %
HCT: 48 % — ABNORMAL HIGH (ref 35.0–45.0)
Hemoglobin: 15.6 g/dL — ABNORMAL HIGH (ref 11.7–15.5)
Lymphs Abs: 1445 cells/uL (ref 850–3900)
MCH: 31 pg (ref 27.0–33.0)
MCHC: 32.5 g/dL (ref 32.0–36.0)
MCV: 95.2 fL (ref 80.0–100.0)
MPV: 8.8 fL (ref 7.5–12.5)
Monocytes Relative: 10.8 %
Neutro Abs: 2835 cells/uL (ref 1500–7800)
Neutrophils Relative %: 56.7 %
Platelets: 293 10*3/uL (ref 140–400)
RBC: 5.04 10*6/uL (ref 3.80–5.10)
RDW: 12.6 % (ref 11.0–15.0)
Total Lymphocyte: 28.9 %
WBC: 5 10*3/uL (ref 3.8–10.8)

## 2020-12-09 LAB — COMPLETE METABOLIC PANEL WITH GFR
AG Ratio: 2.1 (calc) (ref 1.0–2.5)
ALT: 11 U/L (ref 6–29)
AST: 18 U/L (ref 10–35)
Albumin: 4.6 g/dL (ref 3.6–5.1)
Alkaline phosphatase (APISO): 79 U/L (ref 37–153)
BUN: 12 mg/dL (ref 7–25)
CO2: 30 mmol/L (ref 20–32)
Calcium: 9.8 mg/dL (ref 8.6–10.4)
Chloride: 105 mmol/L (ref 98–110)
Creat: 0.9 mg/dL (ref 0.50–1.05)
Globulin: 2.2 g/dL (calc) (ref 1.9–3.7)
Glucose, Bld: 88 mg/dL (ref 65–99)
Potassium: 4.8 mmol/L (ref 3.5–5.3)
Sodium: 142 mmol/L (ref 135–146)
Total Bilirubin: 0.9 mg/dL (ref 0.2–1.2)
Total Protein: 6.8 g/dL (ref 6.1–8.1)
eGFR: 73 mL/min/{1.73_m2} (ref >=60)

## 2020-12-09 NOTE — Patient Instructions (Addendum)
Vaccines You are taking a medication(s) that can suppress your immune system.  The following immunizations are recommended: Flu annually Covid-19  Td/Tdap (tetanus, diphtheria, pertussis) every 10 years Pneumonia (Prevnar 15 then Pneumovax 23 at least 1 year apart.  Alternatively, can take Prevnar 20 without needing additional dose) Shingrix (after age 62): 2 doses from 4 weeks to 6 months apart  Please check with your PCP to make sure you are up to date.   Heart Disease Prevention   Your inflammatory disease increases your risk of heart disease which includes heart attack, stroke, atrial fibrillation (irregular heartbeats), high blood pressure, heart failure and atherosclerosis (plaque in the arteries).  It is important to reduce your risk by:   Keep blood pressure, cholesterol, and blood sugar at healthy levels   Smoking Cessation   Maintain a healthy weight  BMI 20-25   Eat a healthy diet  Plenty of fresh fruit, vegetables, and whole grains  Limit saturated fats, foods high in sodium, and added sugars  DASH and Mediterranean diet   Increase physical activity  Recommend moderate physically activity for 150 minutes per week/ 30 minutes a day for five days a week These can be broken up into three separate ten-minute sessions during the day.   Reduce Stress  Meditation, slow breathing exercises, yoga, coloring books  Dental visits twice a year    Steps to Quit Smoking Smoking tobacco is the leading cause of preventable death. It can affect almost every organ in the body. Smoking puts you and people around you at risk for many serious, long-lasting (chronic) diseases. Quitting smoking can be hard, but it is one of the best things thatyou can do for your health. It is never too late to quit. How do I get ready to quit? When you decide to quit smoking, make a plan to help you succeed. Before you quit: Pick a date to quit. Set a date within the next 2 weeks to give you time to  prepare. Write down the reasons why you are quitting. Keep this list in places where you will see it often. Tell your family, friends, and co-workers that you are quitting. Their support is important. Talk with your doctor about the choices that may help you quit. Find out if your health insurance will pay for these treatments. Know the people, places, things, and activities that make you want to smoke (triggers). Avoid them. What first steps can I take to quit smoking? Throw away all cigarettes at home, at work, and in your car. Throw away the things that you use when you smoke, such as ashtrays and lighters. Clean your car. Make sure to empty the ashtray. Clean your home, including curtains and carpets. What can I do to help me quit smoking? Talk with your doctor about taking medicines and seeing a counselor at the same time. You are more likely to succeed when you do both. If you are pregnant or breastfeeding, talk with your doctor about counseling or other ways to quit smoking. Do not take medicine to help you quit smoking unless your doctor tells you to do so. To quit smoking: Quit right away Quit smoking totally, instead of slowly cutting back on how much you smoke over a period of time. Go to counseling. You are more likely to quit if you go to counseling sessions regularly. Take medicine You may take medicines to help you quit. Some medicines need a prescription, and some you can buy over-the-counter. Some medicines may contain  a drug called nicotine to replace the nicotine in cigarettes. Medicines may: Help you to stop having the desire to smoke (cravings). Help to stop the problems that come when you stop smoking (withdrawal symptoms). Your doctor may ask you to use: Nicotine patches, gum, or lozenges. Nicotine inhalers or sprays. Non-nicotine medicine that is taken by mouth. Find resources Find resources and other ways to help you quit smoking and remain smoke-free after you  quit. These resources are most helpful when you use them often. They include: Online chats with a Social worker. Phone quitlines. Printed Furniture conservator/restorer. Support groups or group counseling. Text messaging programs. Mobile phone apps. Use apps on your mobile phone or tablet that can help you stick to your quit plan. There are many free apps for mobile phones and tablets as well as websites. Examples include Quit Guide from the [employer] and smokefree.gov  What things can I do to make it easier to quit?  Talk to your family and friends. Ask them to support and encourage you. Call a phone quitline (1-800-QUIT-NOW), reach out to support groups, or work with a Social worker. Ask people who smoke to not smoke around you. Avoid places that make you want to smoke, such as: Bars. Parties. Smoke-break areas at work. Spend time with people who do not smoke. Lower the stress in your life. Stress can make you want to smoke. Try these things to help your stress: Getting regular exercise. Doing deep-breathing exercises. Doing yoga. Meditating. Doing a body scan. To do this, close your eyes, focus on one area of your body at a time from head to toe. Notice which parts of your body are tense. Try to relax the muscles in those areas. How will I feel when I quit smoking? Day 1 to 3 weeks Within the first 24 hours, you may start to have some problems that come from quitting tobacco. These problems are very bad 2-3 days after you quit, but they do not often last for more than 2-3 weeks. You may get these symptoms: Mood swings. Feeling restless, nervous, angry, or annoyed. Trouble concentrating. Dizziness. Strong desire for high-sugar foods and nicotine. Weight gain. Trouble pooping (constipation). Feeling like you may vomit (nausea). Coughing or a sore throat. Changes in how the medicines that you take for other issues work in your body. Depression. Trouble sleeping (insomnia). Week 3 and afterward After  the first 2-3 weeks of quitting, you may start to notice more positive results, such as: Better sense of smell and taste. Less coughing and sore throat. Slower heart rate. Lower blood pressure. Clearer skin. Better breathing. Fewer sick days. Quitting smoking can be hard. Do not give up if you fail the first time. Some people need to try a few times before they succeed. Do your best to stick to your quit plan, and talk with yourdoctor if you have any questions or concerns. Summary Smoking tobacco is the leading cause of preventable death. Quitting smoking can be hard, but it is one of the best things that you can do for your health. When you decide to quit smoking, make a plan to help you succeed. Quit smoking right away, not slowly over a period of time. When you start quitting, seek help from your doctor, family, or friends. This information is not intended to replace advice given to you by your health care provider. Make sure you discuss any questions you have with your healthcare provider. Document Revised: 01/12/2019 Document Reviewed: 07/08/2018 Elsevier Patient Education  2022 Elsevier  Inc.  

## 2020-12-10 NOTE — Progress Notes (Signed)
CBC and CMP are stable.

## 2021-01-03 ENCOUNTER — Other Ambulatory Visit: Payer: Self-pay | Admitting: Physician Assistant

## 2021-01-03 DIAGNOSIS — Z79899 Other long term (current) drug therapy: Secondary | ICD-10-CM

## 2021-01-03 DIAGNOSIS — M0579 Rheumatoid arthritis with rheumatoid factor of multiple sites without organ or systems involvement: Secondary | ICD-10-CM

## 2021-01-06 NOTE — Telephone Encounter (Signed)
Next Visit: 05/12/2021  Last Visit: 12/09/2020  Labs: 12/09/2020 CBC and CMP are stable.  Eye exam:  12/24/2020 WNL   Current Dose per office note 12/09/2020: Plaquenil 200 mg 1 tablet by mouth daily  [id number] arthritis involving multiple sites with positive rheumatoid factor  Last Fill: 09/23/2020  Okay to refill Plaquenil?

## 2021-04-01 ENCOUNTER — Other Ambulatory Visit: Payer: Self-pay | Admitting: Physician Assistant

## 2021-04-01 DIAGNOSIS — M0579 Rheumatoid arthritis with rheumatoid factor of multiple sites without organ or systems involvement: Secondary | ICD-10-CM

## 2021-04-01 DIAGNOSIS — Z79899 Other long term (current) drug therapy: Secondary | ICD-10-CM

## 2021-04-01 NOTE — Telephone Encounter (Signed)
Next Visit: 05/12/2021   Last Visit: 12/09/2020   Labs: 12/09/2020 CBC and CMP are stable.   Eye exam:  12/24/2020 WNL    Current Dose per office note 12/09/2020: Plaquenil 200 mg 1 tablet by mouth daily   [id number] arthritis involving multiple sites with positive rheumatoid factor   Last Fill: 01/06/2021   Okay to refill Plaquenil?

## 2021-04-28 NOTE — Progress Notes (Signed)
Office Visit Note  Patient: Amber Sampson             Date of Birth: 1960/12/08           MRN: [id number]             PCP: Mayra Neer, MD Referring: Mayra Neer, MD Visit Date: 05/12/2021 Occupation: @GUAROCC @  Subjective:  Medication monitoring   History of Present Illness: IYARI HAGNER is a 60 y.o. female with history of seropositive rheumatoid arthritis.  She is taking plaquenil 200 mg 1 tablet by mouth daily.  She is tolerating Plaquenil without any side effects and has not missed any doses recently.  She denies any signs or symptoms of a rheumatoid arthritis flare.  She is not experiencing any joint pain or joint swelling at this time.  She denies any morning stiffness, nocturnal pain, or difficulty with ADLs.  She denies any new medical conditions since her last office visit and has not had any medication changes.  She continues to take turmeric, tart cherry, and ginger on a daily basis for the natural anti-inflammatory properties.  She denies any recent infections.     Activities of Daily Living:  Patient reports morning stiffness for 0  none .   Patient Denies nocturnal pain.  Difficulty dressing/grooming: Denies Difficulty climbing stairs: Denies Difficulty getting out of chair: Denies Difficulty using hands for taps, buttons, cutlery, and/or writing: Denies  Review of Systems  Constitutional:  Negative for fatigue.  HENT:  Negative for mouth sores, mouth dryness and nose dryness.   Eyes:  Negative for pain, visual disturbance and dryness.  Respiratory:  Negative for cough, hemoptysis, shortness of breath and difficulty breathing.   Cardiovascular:  Negative for chest pain, palpitations, hypertension and swelling in legs/feet.  Gastrointestinal:  Negative for blood in stool, constipation and diarrhea.  Endocrine: Positive for heat intolerance. Negative for increased urination.  Genitourinary:  Negative for difficulty urinating and painful urination.   Musculoskeletal:  Negative for joint pain, joint pain, joint swelling, myalgias, muscle weakness, morning stiffness, muscle tenderness and myalgias.  Skin:  Negative for color change, pallor, rash, hair loss, nodules/bumps, skin tightness, ulcers and sensitivity to sunlight.  Allergic/Immunologic: Negative for susceptible to infections.  Neurological:  Negative for dizziness, numbness, headaches and weakness.  Hematological:  Positive for bruising/bleeding tendency. Negative for swollen glands.  Psychiatric/Behavioral:  Positive for sleep disturbance. Negative for depressed mood. The patient is not nervous/anxious.    PMFS History:  Patient Active Problem List   Diagnosis Date Noted   Rheumatoid arthritis involving multiple sites with positive rheumatoid factor ([place]) 11/09/2017   History of kidney stones 10/03/2017   Family history of rheumatoid arthritis 10/03/2017    Past Medical History:  Diagnosis Date   Carpal tunnel syndrome    Kidney stones    Rheumatoid arthritis ([place])     Family History  Problem Relation Age of Onset   Heart Problems Mother        pacemaker    Arthritis Father    Dementia Sister    Healthy Daughter    Healthy Daughter    Healthy Son    Past Surgical History:  Procedure Laterality Date   CESAREAN SECTION     CYSTOSCOPY  2018   DILATION AND CURETTAGE OF UTERUS     x6   HERNIA REPAIR     UTERINE SEPTUM RESECTION  1990s   Social History   Social History Narrative   Not on file  Immunization History  Administered Date(s) Administered   Influenza-Unspecified 01/31/2017   PFIZER(Purple Top)SARS-COV-2 Vaccination 07/20/2019, 08/10/2019, 02/22/2020   Pfizer Covid-19 Vaccine Bivalent Booster 2yrs & up 04/20/2021   Pneumococcal Conjugate-13 03/11/2018     Objective: Vital Signs: BP 134/81 (BP Location: Left Arm, Patient Position: Sitting, Cuff Size: Normal)    Pulse 99    Resp 15    Ht 5' 7.5" (1.715 m)    Wt 164 lb (74.4 kg)    BMI 25.31 kg/m     Physical Exam Vitals and nursing note reviewed.  Constitutional:      Appearance: She is well-developed.  HENT:     Head: Normocephalic and atraumatic.  Eyes:     Conjunctiva/sclera: Conjunctivae normal.  Pulmonary:     Effort: Pulmonary effort is normal.  Abdominal:     Palpations: Abdomen is soft.  Musculoskeletal:     Cervical back: Normal range of motion.  Skin:    General: Skin is warm and dry.     Capillary Refill: Capillary refill takes less than 2 seconds.  Neurological:     Mental Status: She is alert and oriented to person, place, and time.  Psychiatric:        Behavior: Behavior normal.     Musculoskeletal Exam: C-spine, thoracic spine, lumbar spine have good range of motion with no discomfort.  Shoulder joints, elbow joints, wrist joints, MCPs, PIPs, DIPs have good range of motion with no synovitis.  Complete fist formation bilaterally.  Hip joints, knee joints, and ankle joints have good range of motion with no discomfort.  No warmth or effusion of knee joints noted.  No tenderness or swelling of ankle joints.  CDAI Exam: CDAI Score: 0  Patient Global: 0 mm; Provider Global: 0 mm Swollen: 0 ; Tender: 0  Joint Exam 05/12/2021   No joint exam has been documented for this visit   There is currently no information documented on the homunculus. Go to the Rheumatology activity and complete the homunculus joint exam.  Investigation: No additional findings.  Imaging: No results found.  Recent Labs: Lab Results  Component Value Date   WBC 5.0 12/09/2020   HGB 15.6 (H) 12/09/2020   PLT 293 12/09/2020   NA 142 12/09/2020   K 4.8 12/09/2020   CL 105 12/09/2020   CO2 30 12/09/2020   GLUCOSE 88 12/09/2020   BUN 12 12/09/2020   CREATININE 0.90 12/09/2020   BILITOT 0.9 12/09/2020   AST 18 12/09/2020   ALT 11 12/09/2020   PROT 6.8 12/09/2020   CALCIUM 9.8 12/09/2020   GFRAA 81 07/07/2020    Speciality Comments: PLQ Eye Exam: 12/24/2020 WNL @ [facility] Follow up 1 year  Procedures:  No procedures performed Allergies: Levaquin [levofloxacin in d5w] and Sulfa antibiotics    Assessment / Plan:     Visit Diagnoses: Rheumatoid arthritis involving multiple sites with positive rheumatoid factor (HCC) - RF positive, anti-CCP positive: She has no joint tenderness or synovitis on examination today.  She is clinically doing well taking plaquenil 200 mg 1 tablet by mouth daily.  She is tolerating plaquenil without any side effects and has not missed any doses.  She has not had any signs or symptoms of a RA flare.  She has not had any morning stiffness,  nocturnal pain, or difficulty with ADLs.  She will remain on plaquenil as prescribed. No medication changes will be made at this time. She was advised to notify us if she develops increased  joint pain or joint swelling.  She will follow-up in the office in 5 months.  High risk medication use - Plaquenil 200 mg 1 tablet by mouth daily. PLQ Eye Exam: 12/24/2020 WNL @ [facility] Follow up 1 year.  CBC and CMP were drawn on 12/09/2020.  Hemoglobin and hematocrit are borderline elevated but the rest of CBC was within normal limits.  CMP within normal limits.  She is due to update lab work today.  Orders for CBC and CMP were released.- Plan: CBC with Differential/Platelet, COMPLETE METABOLIC PANEL WITH GFR No recent infections.  Tenosynovitis of right wrist - Resolved.   Other medical conditions are listed as follows:   Family history of rheumatoid arthritis  History of kidney stones  Tobacco use  Orders: Orders Placed This Encounter  Procedures   CBC with Differential/Platelet   COMPLETE METABOLIC PANEL WITH GFR   No orders of the defined types were placed in this encounter.    Follow-Up Instructions: Return in about 5 months (around 10/10/2021) for Rheumatoid arthritis.   Ofilia Neas, PA-C  Note - This record has been created using Dragon software.  Chart creation errors have  been sought, but may not always  have been located. Such creation errors do not reflect on  the standard of medical care.

## 2021-05-12 ENCOUNTER — Other Ambulatory Visit: Payer: Self-pay

## 2021-05-12 ENCOUNTER — Ambulatory Visit (INDEPENDENT_AMBULATORY_CARE_PROVIDER_SITE_OTHER): Payer: 59 | Admitting: Physician Assistant

## 2021-05-12 ENCOUNTER — Encounter: Payer: Self-pay | Admitting: Physician Assistant

## 2021-05-12 VITALS — BP 134/81 | HR 99 | Resp 15 | Ht 67.5 in | Wt 164.0 lb

## 2021-05-12 DIAGNOSIS — Z79899 Other long term (current) drug therapy: Secondary | ICD-10-CM | POA: Diagnosis not present

## 2021-05-12 DIAGNOSIS — M659 Synovitis and tenosynovitis, unspecified: Secondary | ICD-10-CM | POA: Diagnosis not present

## 2021-05-12 DIAGNOSIS — Z720 Tobacco use: Secondary | ICD-10-CM

## 2021-05-12 DIAGNOSIS — Z8261 Family history of arthritis: Secondary | ICD-10-CM | POA: Diagnosis not present

## 2021-05-12 DIAGNOSIS — M65931 Unspecified synovitis and tenosynovitis, right forearm: Secondary | ICD-10-CM

## 2021-05-12 DIAGNOSIS — M0579 Rheumatoid arthritis with rheumatoid factor of multiple sites without organ or systems involvement: Secondary | ICD-10-CM

## 2021-05-12 DIAGNOSIS — Z87442 Personal history of urinary calculi: Secondary | ICD-10-CM

## 2021-05-12 LAB — COMPLETE METABOLIC PANEL WITH GFR
AG Ratio: 1.9 (calc) (ref 1.0–2.5)
ALT: 20 U/L (ref 6–29)
AST: 22 U/L (ref 10–35)
Albumin: 4.3 g/dL (ref 3.6–5.1)
Alkaline phosphatase (APISO): 86 U/L (ref 37–153)
BUN: 9 mg/dL (ref 7–25)
CO2: 31 mmol/L (ref 20–32)
Calcium: 9.6 mg/dL (ref 8.6–10.4)
Chloride: 105 mmol/L (ref 98–110)
Creat: 0.82 mg/dL (ref 0.50–1.05)
Globulin: 2.3 g/dL (calc) (ref 1.9–3.7)
Glucose, Bld: 98 mg/dL (ref 65–99)
Potassium: 4.7 mmol/L (ref 3.5–5.3)
Sodium: 142 mmol/L (ref 135–146)
Total Bilirubin: 0.7 mg/dL (ref 0.2–1.2)
Total Protein: 6.6 g/dL (ref 6.1–8.1)
eGFR: 81 mL/min/{1.73_m2} (ref >=60)

## 2021-05-12 LAB — CBC WITH DIFFERENTIAL/PLATELET
Absolute Monocytes: 450 cells/uL (ref 200–950)
Basophils Absolute: 17 cells/uL (ref 0–200)
Basophils Relative: 0.3 %
Eosinophils Absolute: 257 cells/uL (ref 15–500)
Eosinophils Relative: 4.5 %
HCT: 46.6 % — ABNORMAL HIGH (ref 35.0–45.0)
Hemoglobin: 15.3 g/dL (ref 11.7–15.5)
Lymphs Abs: 1630 cells/uL (ref 850–3900)
MCH: 31.1 pg (ref 27.0–33.0)
MCHC: 32.8 g/dL (ref 32.0–36.0)
MCV: 94.7 fL (ref 80.0–100.0)
MPV: 8.8 fL (ref 7.5–12.5)
Monocytes Relative: 7.9 %
Neutro Abs: 3346 cells/uL (ref 1500–7800)
Neutrophils Relative %: 58.7 %
Platelets: 320 10*3/uL (ref 140–400)
RBC: 4.92 10*6/uL (ref 3.80–5.10)
RDW: 11.9 % (ref 11.0–15.0)
Total Lymphocyte: 28.6 %
WBC: 5.7 10*3/uL (ref 3.8–10.8)

## 2021-05-13 NOTE — Progress Notes (Signed)
CBC and CMP WNL

## 2021-07-03 ENCOUNTER — Telehealth: Payer: Self-pay

## 2021-07-03 MED ORDER — PREDNISONE 5 MG PO TABS: ORAL_TABLET | ORAL | 0 refills | Status: DC

## 2021-07-03 NOTE — Telephone Encounter (Signed)
Patient called stating she is experiencing pain and swelling in her right hand, wrist, and fingers.  Patient is requesting a prescription of Prednisone to help her over the weekend.   ?

## 2021-07-03 NOTE — Telephone Encounter (Signed)
Spoke to [employer], PA-C on the phone and advised her of the situation. Received a verbal order for a prednisone taper starting at 20mg  daily and taper by 5mg  every 4 days. Prescription sent to the pharmacy. Called patient and advised.  ?

## 2021-07-06 ENCOUNTER — Other Ambulatory Visit: Payer: Self-pay | Admitting: Physician Assistant

## 2021-07-06 DIAGNOSIS — Z79899 Other long term (current) drug therapy: Secondary | ICD-10-CM

## 2021-07-06 DIAGNOSIS — M0579 Rheumatoid arthritis with rheumatoid factor of multiple sites without organ or systems involvement: Secondary | ICD-10-CM

## 2021-07-06 NOTE — Telephone Encounter (Signed)
Next Visit: 10/13/2021 ? ?Last Visit: 05/12/2021 ? ?Labs: 05/12/2021 CBC and CMP WNL ? ?Eye exam: 12/24/2020 WNL   ? ?Current Dose per office note 05/12/2021: Plaquenil 200 mg 1 tablet by mouth daily ? ?[id number] arthritis involving multiple sites with positive rheumatoid factor  ? ?Last Fill: 04/01/2022 ? ?Okay to refill Plaquenil?  ?

## 2021-09-30 NOTE — Progress Notes (Signed)
Office Visit Note  Patient: Amber Sampson             Date of Birth: 08-Oct-1960           MRN: [id number]             PCP: Mayra Neer, MD Referring: Mayra Neer, MD Visit Date: 10/13/2021 Occupation: '@GUAROCC'$ @  Subjective:  Rheumatoid Arthritis flares 07/2021 x 2, 10/2021   History of Present Illness: Amber Sampson is a 61 y.o. female with history of seropositive rheumatoid arthritis.  She had been doing well on hydroxychloroquine 200 mg twice daily for a long time.  As she went into remission the dose of hydroxychloroquine was reduced to 1 tablet p.o. daily in May 2021.  She did fine until March 2023.  She had a flare in March with pain and swelling in her left wrist and left hand.  Few days later she had another flare with right hand pain and burning.  She was given a prednisone taper.  Symptoms resolved after the prednisone taper.  She had another flare in the first week of June with pain and swelling in her right hand.  She took Aleve and the symptoms resolved.  She is currently not having any symptoms.  Activities of Daily Living:  Patient reports morning stiffness for 0  none .   Patient Reports nocturnal pain.  Difficulty dressing/grooming: Denies Difficulty climbing stairs: Denies Difficulty getting out of chair: Denies Difficulty using hands for taps, buttons, cutlery, and/or writing: Denies  Review of Systems  Constitutional:  Negative for fatigue.  HENT:  Negative for mouth dryness.   Eyes:  Negative for dryness.  Respiratory:  Negative for shortness of breath.   Cardiovascular:  Negative for swelling in legs/feet.  Gastrointestinal:  Negative for constipation.  Endocrine: Positive for heat intolerance.  Genitourinary:  Negative for difficulty urinating.  Musculoskeletal:  Positive for joint swelling.  Skin:  Negative for rash.  Allergic/Immunologic: Negative for susceptible to infections.  Neurological:  Negative for numbness.  Hematological:  Positive  for bruising/bleeding tendency.  Psychiatric/Behavioral:  Positive for sleep disturbance.     PMFS History:  Patient Active Problem List   Diagnosis Date Noted   Rheumatoid arthritis involving multiple sites with positive rheumatoid factor ([place]) 11/09/2017   History of kidney stones 10/03/2017   Family history of rheumatoid arthritis 10/03/2017    Past Medical History:  Diagnosis Date   Carpal tunnel syndrome    Kidney stones    Rheumatoid arthritis ([place])     Family History  Problem Relation Age of Onset   Heart Problems Mother        pacemaker    Arthritis Father    Dementia Sister    Healthy Daughter    Healthy Daughter    Healthy Son    Past Surgical History:  Procedure Laterality Date   CESAREAN SECTION     CYSTOSCOPY  2018   DILATION AND CURETTAGE OF UTERUS     x6   HERNIA REPAIR     UTERINE SEPTUM RESECTION  1990s   Social History   Social History Narrative   Not on file   Immunization History  Administered Date(s) Administered   Influenza-Unspecified 01/31/2017   PFIZER(Purple Top)SARS-COV-2 Vaccination 07/20/2019, 08/10/2019, 02/22/2020   Pfizer Covid-19 Vaccine Bivalent Booster 74yr & up 04/20/2021   Pneumococcal Conjugate-13 03/11/2018     Objective: Vital Signs: BP (!) 153/82 (BP Location: Left Arm, Patient Position: Sitting, Cuff Size: Normal)   Pulse  90   Resp 15   Ht 5' 7.5" (1.715 m)   Wt 165 lb 9.6 oz (75.1 kg)   BMI 25.55 kg/m    Physical Exam Vitals and nursing note reviewed.  Constitutional:      Appearance: She is well-developed.  HENT:     Head: Normocephalic and atraumatic.  Eyes:     Conjunctiva/sclera: Conjunctivae normal.  Cardiovascular:     Rate and Rhythm: Normal rate and regular rhythm.     Heart sounds: Normal heart sounds.  Pulmonary:     Effort: Pulmonary effort is normal.     Breath sounds: Normal breath sounds.  Abdominal:     General: Bowel sounds are normal.     Palpations: Abdomen is soft.   Musculoskeletal:     Cervical back: Normal range of motion.  Lymphadenopathy:     Cervical: No cervical adenopathy.  Skin:    General: Skin is warm and dry.     Capillary Refill: Capillary refill takes less than 2 seconds.  Neurological:     Mental Status: She is alert and oriented to person, place, and time.  Psychiatric:        Behavior: Behavior normal.      Musculoskeletal Exam: C-spine was in good range of motion.  Shoulder joints, elbow joints, wrist joints, MCPs PIPs and DIPs with good range of motion with no synovitis.  Hip joints, knee joints, ankles, MTPs and PIPs with good range of motion with no synovitis.  CDAI Exam: CDAI Score: 0  Patient Global: 0 mm; Provider Global: 0 mm Swollen: 0 ; Tender: 0  Joint Exam 10/13/2021   No joint exam has been documented for this visit   There is currently no information documented on the homunculus. Go to the Rheumatology activity and complete the homunculus joint exam.  Investigation: No additional findings.  Imaging: No results found.  Recent Labs: Lab Results  Component Value Date   WBC 5.7 05/12/2021   HGB 15.3 05/12/2021   PLT 320 05/12/2021   NA 142 05/12/2021   K 4.7 05/12/2021   CL 105 05/12/2021   CO2 31 05/12/2021   GLUCOSE 98 05/12/2021   BUN 9 05/12/2021   CREATININE 0.82 05/12/2021   BILITOT 0.7 05/12/2021   AST 22 05/12/2021   ALT 20 05/12/2021   PROT 6.6 05/12/2021   CALCIUM 9.6 05/12/2021   GFRAA 81 07/07/2020    Speciality Comments: PLQ Eye Exam: 12/24/2020 WNL @ [facility] Follow up 1 year  Procedures:  No procedures performed Allergies: Levaquin [levofloxacin in d5w] and Sulfa antibiotics   Assessment / Plan:     Visit Diagnoses: Rheumatoid arthritis involving multiple sites with positive rheumatoid factor (HCC) - RF positive, anti-CCP positive:  -Patient had been doing well on hydroxychloroquine 1 tablet p.o. daily until March 2023.  Her dose of hydroxychloroquine was reduced as  she went into remission.  She has been on hydroxychloroquine 200 mg p.o. daily since May 2021.  She had 2 flares of rheumatoid arthritis in March requiring a prednisone taper and another flare and first week of June which resolved after taking Aleve.  She currently is asymptomatic.  We detailed discussion regarding receiving original dose of hydroxychloroquine at 200 mg p.o. twice daily.  Her height is 7 feet and half inches.  Patient was in agreement.  If she does well over time then we can reduce the dose to Plaquenil 200 mg twice daily Monday to Friday only.  She was advised to  contact us if she develops a flare.  Plan: hydroxychloroquine (PLAQUENIL) 200 MG tablet, Sedimentation rate  High risk medication use - Plaquenil 200 mg 1 tablet by mouth daily. PLQ Eye Exam: 12/24/2020 - Plan: hydroxychloroquine (PLAQUENIL) 200 MG tablet, CBC with Differential/Platelet, COMPLETE METABOLIC PANEL WITH GFR today and then every 5 months.  Information on immunization was placed in the AVS.  She has a follow-up eye examination scheduled.  Tenosynovitis of right wrist-resolved.  Family history of rheumatoid arthritis  History of kidney stones  Tobacco use-smoking cessation was discussed at length.She has positive anti-CCP antibody which is associated with smoking.  Association of smoking with rheumatoid arthritis was also discussed.  A handout was placed in the AVS.  .  Orders: Orders Placed This Encounter  Procedures   CBC with Differential/Platelet   COMPLETE METABOLIC PANEL WITH GFR   Sedimentation rate   Meds ordered this encounter  Medications   hydroxychloroquine (PLAQUENIL) 200 MG tablet    Sig: Take 1 tablet (200 mg total) by mouth 2 (two) times daily.    Dispense:  180 tablet    Refill:  0     Follow-Up Instructions: Return in about 5 months (around 03/15/2022) for Rheumatoid arthritis.   Bo Merino, MD  Note - This record has been created using Editor, commissioning.  Chart creation  errors have been sought, but may not always  have been located. Such creation errors do not reflect on  the standard of medical care.

## 2021-10-13 ENCOUNTER — Other Ambulatory Visit: Payer: Self-pay | Admitting: Physician Assistant

## 2021-10-13 ENCOUNTER — Ambulatory Visit (INDEPENDENT_AMBULATORY_CARE_PROVIDER_SITE_OTHER): Payer: 59 | Admitting: Rheumatology

## 2021-10-13 ENCOUNTER — Encounter: Payer: Self-pay | Admitting: Rheumatology

## 2021-10-13 VITALS — BP 153/82 | HR 90 | Resp 15 | Ht 67.5 in | Wt 165.6 lb

## 2021-10-13 DIAGNOSIS — M0579 Rheumatoid arthritis with rheumatoid factor of multiple sites without organ or systems involvement: Secondary | ICD-10-CM

## 2021-10-13 DIAGNOSIS — M659 Synovitis and tenosynovitis, unspecified: Secondary | ICD-10-CM | POA: Diagnosis not present

## 2021-10-13 DIAGNOSIS — Z79899 Other long term (current) drug therapy: Secondary | ICD-10-CM | POA: Diagnosis not present

## 2021-10-13 DIAGNOSIS — Z720 Tobacco use: Secondary | ICD-10-CM

## 2021-10-13 DIAGNOSIS — Z87442 Personal history of urinary calculi: Secondary | ICD-10-CM

## 2021-10-13 DIAGNOSIS — Z8261 Family history of arthritis: Secondary | ICD-10-CM

## 2021-10-13 MED ORDER — HYDROXYCHLOROQUINE SULFATE 200 MG PO TABS: 200.0000 mg | ORAL_TABLET | Freq: Two times a day (BID) | ORAL | 0 refills | Status: DC

## 2021-10-13 NOTE — Patient Instructions (Addendum)
Standing Labs We placed an order today for your standing lab work.   Please have your standing labs drawn in November  If possible, please have your labs drawn 2 weeks prior to your appointment so that the provider can discuss your results at your appointment.  Please note that you may see your imaging and lab results in [facility] before we have reviewed them. We may be awaiting multiple results to interpret others before contacting you. Please allow our office up to 72 hours to thoroughly review all of the results before contacting the office for clarification of your results.  We have open lab daily: Monday through Thursday from 1:30-4:30 PM and Friday from 1:30-4:00 PM at the office of Dr. Bo Merino, [facility].   Please be advised, all patients with office appointments requiring lab work will take precedent over walk-in lab work.  If possible, please come for your lab work on Monday and Friday afternoons, as you may experience shorter wait times. The office is located at [address] [place], [place], [place] [address] No appointment is necessary.   Labs are drawn by [facility]. Please bring your co-pay at the time of your lab draw.  You may receive a bill from [facility] for your lab work.  Please note if you are on Hydroxychloroquine and and an order has been placed for a Hydroxychloroquine level, you will need to have it drawn 4 hours or more after your last dose.  If you wish to have your labs drawn at another location, please call the office 24 hours in advance to send orders.  If you have any questions regarding directions or hours of operation,  please call [phone].   As a reminder, please drink plenty of water prior to coming for your lab work. Thanks!   Vaccines You are taking a medication(s) that can suppress your immune system.  The following immunizations are recommended: Flu annually Covid-19  Td/Tdap (tetanus, diphtheria, pertussis) every 10  years Pneumonia (Prevnar 15 then Pneumovax 23 at least 1 year apart.  Alternatively, can take Prevnar 20 without needing additional dose) Shingrix: 2 doses from 4 weeks to 6 months apart  Please check with your PCP to make sure you are up to date. Managing the Challenge of Quitting Smoking Quitting smoking is a physical and mental challenge. You may have cravings, withdrawal symptoms, and temptation to smoke. Before quitting, work with your health care provider to make a plan that can help you manage quitting. Making a plan before you quit may keep you from smoking when you have the urge to smoke while trying to quit. How to manage lifestyle changes Managing stress Stress can make you want to smoke, and wanting to smoke may cause stress. It is important to find ways to manage your stress. You could try some of the following: Practice relaxation techniques. Breathe slowly and deeply, in through your nose and out through your mouth. Listen to music. Soak in a bath or take a shower. Imagine a peaceful place or vacation. Get some support. Talk with family or friends about your stress. Join a support group. Talk with a counselor or therapist. Get some physical activity. Go for a walk, run, or bike ride. Play a favorite sport. Practice yoga.  Medicines Talk with your health care provider about medicines that might help you deal with cravings and make quitting easier for you. Relationships Social situations can be difficult when you are quitting smoking. To manage this, you can: Avoid parties  and other social situations where people might be smoking. Avoid alcohol. Leave right away if you have the urge to smoke. Explain to your family and friends that you are quitting smoking. Ask for support and let them know you might be a bit grumpy. Plan activities where smoking is not an option. General instructions Be aware that many people gain weight after they quit smoking. However, not everyone  does. To keep from gaining weight, have a plan in place before you quit, and stick to the plan after you quit. Your plan should include: Eating healthy snacks. When you have a craving, it may help to: Eat popcorn, or try carrots, celery, or other cut vegetables. Chew sugar-free gum. Changing how you eat. Eat small portion sizes at meals. Eat 4-6 small meals throughout the day instead of 1-2 large meals a day. Be mindful when you eat. You should avoid watching television or doing other things that might distract you as you eat. Exercising regularly. Make time to exercise each day. If you do not have time for a long workout, do short bouts of exercise for 5-10 minutes several times a day. Do some form of strengthening exercise, such as weight lifting. Do some exercise that gets your heart beating and causes you to breathe deeply, such as walking fast, running, swimming, or biking. This is very important. Drinking plenty of water or other low-calorie or no-calorie drinks. Drink enough fluid to keep your urine pale yellow.  How to recognize withdrawal symptoms Your body and mind may experience discomfort as you try to get used to not having nicotine in your system. These effects are called withdrawal symptoms. They may include: Feeling hungrier than normal. Having trouble concentrating. Feeling irritable or restless. Having trouble sleeping. Feeling depressed. Craving a cigarette. These symptoms may surprise you, but they are normal to have when quitting smoking. To manage withdrawal symptoms: Avoid places, people, and activities that trigger your cravings. Remember why you want to quit. Get plenty of sleep. Avoid coffee and other drinks that contain caffeine. These may worsen some of your symptoms. How to manage cravings Come up with a plan for how to deal with your cravings. The plan should include the following: A definition of the specific situation you want to deal with. An activity  or action you will take to replace smoking. A clear idea for how this action will help. The name of someone who could help you with this. Cravings usually last for 5-10 minutes. Consider taking the following actions to help you with your plan to deal with cravings: Keep your mouth busy. Chew sugar-free gum. Suck on hard candies or a straw. Brush your teeth. Keep your hands and body busy. Change to a different activity right away. Squeeze or play with a ball. Do an activity or a hobby, such as making bead jewelry, practicing needlepoint, or working with wood. Mix up your normal routine. Take a short exercise break. Go for a quick walk, or run up and down stairs. Focus on doing something kind or helpful for someone else. Call a friend or family member to talk during a craving. Join a support group. Contact a quitline. Where to find support To get help or find a support group: Call the [facility]'s Smoking Quitline: 1-800-QUIT-NOW 304-446-4795) Text QUIT to SmokefreeTXT: 742595 Where to find more information Visit these websites to find more information on quitting smoking: U.S. Department of Health and Human Services: www.smokefree.gov American Lung Association: www.freedomfromsmoking.org Centers for Disease Control  and Prevention (CDC): http://www.wolf.info/ American Heart Association: www.heart.org Contact a health care provider if: You want to change your plan for quitting. The medicines you are taking are not helping. Your eating feels out of control or you cannot sleep. You feel depressed or become very anxious. Summary Quitting smoking is a physical and mental challenge. You will face cravings, withdrawal symptoms, and temptation to smoke again. Preparation can help you as you go through these challenges. Try different techniques to manage stress, handle social situations, and prevent weight gain. You can deal with cravings by keeping your mouth busy (such as by chewing  gum), keeping your hands and body busy, calling family or friends, or contacting a quitline for people who want to quit smoking. You can deal with withdrawal symptoms by avoiding places where people smoke, getting plenty of rest, and avoiding drinks that contain caffeine. This information is not intended to replace advice given to you by your health care provider. Make sure you discuss any questions you have with your health care provider. Document Revised: 04/10/2021 Document Reviewed: 04/10/2021 Elsevier Patient Education  [place].

## 2021-10-13 NOTE — Telephone Encounter (Signed)
Next Visit: 10/13/2021   Last Visit: 05/12/2021   Labs: 05/12/2021 CBC and CMP WNL   Eye exam: 12/24/2020 WNL     Current Dose per office note 05/12/2021: Plaquenil 200 mg 1 tablet by mouth daily   [id number] arthritis involving multiple sites with positive rheumatoid factor    Last Fill: 07/06/2021   Okay to refill Plaquenil?

## 2021-10-14 LAB — COMPLETE METABOLIC PANEL WITH GFR
AG Ratio: 1.8 (calc) (ref 1.0–2.5)
ALT: 24 U/L (ref 6–29)
AST: 28 U/L (ref 10–35)
Albumin: 4.4 g/dL (ref 3.6–5.1)
Alkaline phosphatase (APISO): 74 U/L (ref 37–153)
BUN: 14 mg/dL (ref 7–25)
CO2: 29 mmol/L (ref 20–32)
Calcium: 9.6 mg/dL (ref 8.6–10.4)
Chloride: 105 mmol/L (ref 98–110)
Creat: 0.98 mg/dL (ref 0.50–1.05)
Globulin: 2.4 g/dL (calc) (ref 1.9–3.7)
Glucose, Bld: 97 mg/dL (ref 65–99)
Potassium: 4.4 mmol/L (ref 3.5–5.3)
Sodium: 143 mmol/L (ref 135–146)
Total Bilirubin: 0.6 mg/dL (ref 0.2–1.2)
Total Protein: 6.8 g/dL (ref 6.1–8.1)
eGFR: 65 mL/min/{1.73_m2} (ref >=60)

## 2021-10-14 LAB — CBC WITH DIFFERENTIAL/PLATELET
Absolute Monocytes: 485 cells/uL (ref 200–950)
Basophils Absolute: 19 cells/uL (ref 0–200)
Basophils Relative: 0.4 %
Eosinophils Absolute: 101 cells/uL (ref 15–500)
Eosinophils Relative: 2.1 %
HCT: 45.9 % — ABNORMAL HIGH (ref 35.0–45.0)
Hemoglobin: 15.3 g/dL (ref 11.7–15.5)
Lymphs Abs: 1666 cells/uL (ref 850–3900)
MCH: 32 pg (ref 27.0–33.0)
MCHC: 33.3 g/dL (ref 32.0–36.0)
MCV: 96 fL (ref 80.0–100.0)
MPV: 8.7 fL (ref 7.5–12.5)
Monocytes Relative: 10.1 %
Neutro Abs: 2530 cells/uL (ref 1500–7800)
Neutrophils Relative %: 52.7 %
Platelets: 295 10*3/uL (ref 140–400)
RBC: 4.78 10*6/uL (ref 3.80–5.10)
RDW: 11.9 % (ref 11.0–15.0)
Total Lymphocyte: 34.7 %
WBC: 4.8 10*3/uL (ref 3.8–10.8)

## 2021-10-14 LAB — SEDIMENTATION RATE: Sed Rate: 6 mm/h (ref 0–30)

## 2021-10-14 NOTE — Progress Notes (Signed)
CBC, CMP and sed rate are normal.

## 2022-01-14 ENCOUNTER — Other Ambulatory Visit: Payer: Self-pay | Admitting: Rheumatology

## 2022-01-14 DIAGNOSIS — M0579 Rheumatoid arthritis with rheumatoid factor of multiple sites without organ or systems involvement: Secondary | ICD-10-CM

## 2022-01-14 DIAGNOSIS — Z79899 Other long term (current) drug therapy: Secondary | ICD-10-CM

## 2022-01-14 NOTE — Telephone Encounter (Signed)
Next Visit: 03/17/2022  Last Visit: 10/13/2021  Labs: 10/13/2021 CBC, CMP and sed rate are normal.  Eye exam:  12/28/2021 WNL    Current Dose per office note 10/13/2021: hydroxychloroquine at 200 mg p.o. twice daily  [id number] arthritis involving multiple sites with positive rheumatoid factor   Last Fill: 10/13/2021  Okay to refill Plaquenil?

## 2022-03-03 NOTE — Progress Notes (Unsigned)
Office Visit Note  Patient: Amber Sampson             Date of Birth: 26-Mar-1961           MRN: [id number]             PCP: Mayra Neer, MD Referring: Mayra Neer, MD Visit Date: 03/17/2022 Occupation: '@GUAROCC'$ @  Subjective:  Discuss plaquenil dosing   History of Present Illness: Amber Sampson is a 61 y.o. female with history of seropositive rheumatoid arthritis.  She remains on Plaquenil 200 mg 1 tablet by mouth twice daily.  The dose of Plaquenil was increased after her last office visit on 10/13/2021 due to increased flares.  She states that within a week her joint pain and stiffness improved in both hands.  She has been tolerating the increased dose of Plaquenil without any side effects.  She denies any recent or recurrent infections.  She is planning on getting the annual flu shot, COVID booster, and RSV vaccine.  She currently rates her rheumatoid arthritis pain and swelling is 0 out of 10.  She denies any increased joint pain, joint swelling, joint stiffness, nocturnal pain, or difficulty with ADLs.  She would like to try reducing the dose of Plaquenil since she has noticed a much clinical improvement.  She denies any new medical conditions.      Activities of Daily Living:  Patient reports morning stiffness for 0 minutes.   Patient Denies nocturnal pain.  Difficulty dressing/grooming: Denies Difficulty climbing stairs: Denies Difficulty getting out of chair: Denies Difficulty using hands for taps, buttons, cutlery, and/or writing: Denies  Review of Systems  Constitutional:  Negative for fatigue.  HENT:  Negative for mouth sores and mouth dryness.   Eyes:  Negative for dryness.  Respiratory:  Negative for shortness of breath.   Cardiovascular:  Negative for chest pain and palpitations.  Gastrointestinal:  Negative for blood in stool, constipation and diarrhea.  Endocrine: Negative for increased urination.  Genitourinary:  Negative for involuntary urination.   Musculoskeletal:  Negative for joint pain, gait problem, joint pain, joint swelling, myalgias, muscle weakness, morning stiffness, muscle tenderness and myalgias.  Skin:  Negative for color change, rash, hair loss and sensitivity to sunlight.  Allergic/Immunologic: Negative for susceptible to infections.  Neurological:  Positive for headaches. Negative for dizziness.  Hematological:  Negative for swollen glands.  Psychiatric/Behavioral:  Positive for sleep disturbance. Negative for depressed mood. The patient is not nervous/anxious.     PMFS History:  Patient Active Problem List   Diagnosis Date Noted   Rheumatoid arthritis involving multiple sites with positive rheumatoid factor ([place]) 11/09/2017   History of kidney stones 10/03/2017   Family history of rheumatoid arthritis 10/03/2017    Past Medical History:  Diagnosis Date   Carpal tunnel syndrome    Kidney stones    Rheumatoid arthritis ([place])     Family History  Problem Relation Age of Onset   Heart Problems Mother        pacemaker    Arthritis Father    Dementia Sister    Healthy Daughter    Healthy Daughter    Healthy Son    Past Surgical History:  Procedure Laterality Date   CESAREAN SECTION     CYSTOSCOPY  2018   DILATION AND CURETTAGE OF UTERUS     x6   HERNIA REPAIR     UTERINE SEPTUM RESECTION  1990s   Social History   Social History Narrative   Not on  file   Immunization History  Administered Date(s) Administered   Influenza-Unspecified 01/31/2017   PFIZER(Purple Top)SARS-COV-2 Vaccination 07/20/2019, 08/10/2019, 02/22/2020   Pfizer Covid-19 Vaccine Bivalent Booster 48yr & up 04/20/2021   Pneumococcal Conjugate-13 03/11/2018     Objective: Vital Signs: BP (!) 153/88 (BP Location: Left Arm, Patient Position: Sitting, Cuff Size: Normal)   Pulse (!) 103   Resp 17   Ht 5' 7.5" (1.715 m)   Wt 175 lb 6.4 oz (79.6 kg)   BMI 27.07 kg/m    Physical Exam Vitals and nursing note reviewed.   Constitutional:      Appearance: She is well-developed.  HENT:     Head: Normocephalic and atraumatic.  Eyes:     Conjunctiva/sclera: Conjunctivae normal.  Cardiovascular:     Rate and Rhythm: Normal rate and regular rhythm.     Heart sounds: Normal heart sounds.  Pulmonary:     Effort: Pulmonary effort is normal.     Breath sounds: Normal breath sounds.  Abdominal:     General: Bowel sounds are normal.     Palpations: Abdomen is soft.  Musculoskeletal:     Cervical back: Normal range of motion.  Skin:    General: Skin is warm and dry.     Capillary Refill: Capillary refill takes less than 2 seconds.  Neurological:     Mental Status: She is alert and oriented to person, place, and time.  Psychiatric:        Behavior: Behavior normal.      Musculoskeletal Exam: C-spine, thoracic spine, lumbar spine have good range of motion.  No midline spinal tenderness or SI joint tenderness.  Shoulder joints, elbow joints, wrist joints, MCPs, PIPs, DIPs have good range of motion with no synovitis.  Complete fist formation bilaterally.  Hip joints have good range of motion with no groin pain.  Knee joints have good range of motion with no warmth or effusion.  Ankle joints have good range of motion with no tenderness or joint swelling.  CDAI Exam: CDAI Score: -- Patient Global: 0 mm; Provider Global: 0 mm Swollen: --; Tender: -- Joint Exam 03/17/2022   No joint exam has been documented for this visit   There is currently no information documented on the homunculus. Go to the Rheumatology activity and complete the homunculus joint exam.  Investigation: No additional findings.  Imaging: No results found.  Recent Labs: Lab Results  Component Value Date   WBC 4.8 10/13/2021   HGB 15.3 10/13/2021   PLT 295 10/13/2021   NA 143 10/13/2021   K 4.4 10/13/2021   CL 105 10/13/2021   CO2 29 10/13/2021   GLUCOSE 97 10/13/2021   BUN 14 10/13/2021   CREATININE 0.98 10/13/2021   BILITOT  0.6 10/13/2021   AST 28 10/13/2021   ALT 24 10/13/2021   PROT 6.8 10/13/2021   CALCIUM 9.6 10/13/2021   GFRAA 81 07/07/2020    Speciality Comments: PLQ Eye Exam: 12/28/2021 WNL @ [facility] Follow up 1 year  Procedures:  No procedures performed Allergies: Levaquin [levofloxacin in d5w] and Sulfa antibiotics   Assessment / Plan:     Visit Diagnoses: Rheumatoid arthritis involving multiple sites with positive rheumatoid factor (HCC) - RF positive, anti-CCP positive: She has no joint tenderness or synovitis on examination today.  She has not been experiencing any morning stiffness, nocturnal pain, or difficulty with ADLs.  She has clinically been doing well taking Plaquenil 200 mg 1 tablet by mouth twice daily.  She  has been tolerating Plaquenil without any side effects and has not missed any doses recently.  The dose of Plaquenil was increased after her last office visit on 10/13/2021 from once daily to twice daily due to increased flares.  Within a week her joint pain and stiffness in her hands had resolved.  She requested to reduce the dose of Plaquenil since she has clinically been doing well and would like to minimize any adverse effects with long-term Plaquenil use.  I offered to order a hydroxychloroquine level to ensure she is not at a toxicity range but she declined at this time.  She would like to try reducing Plaquenil to 200 mg 1 tab in the morning and half tablet in the evening.  She was advised to notify us if she develops increased joint pain or joint swelling.  She will follow-up in the office in 5 months or sooner if needed.  High risk medication use - Plaquenil 200 mg 1 tablet in the morning and 1/2 tablet in the evening.  CBC and CMP drawn on 10/13/21. Orders for CBC and CMP released today.   PLQ Eye Exam: 12/28/2021 WNL @ [facility] Follow up 1 year.  - Plan: COMPLETE METABOLIC PANEL WITH GFR, CBC with Differential/Platelet  Tenosynovitis of right wrist:  Resolved.    Other medical conditions are listed as follows:   Family history of rheumatoid arthritis  History of kidney stones  Tobacco use  Orders: Orders Placed This Encounter  Procedures   COMPLETE METABOLIC PANEL WITH GFR   CBC with Differential/Platelet   No orders of the defined types were placed in this encounter.    Follow-Up Instructions: Return in about 5 months (around 08/16/2022) for Rheumatoid arthritis.   Ofilia Neas, PA-C  Note - This record has been created using Dragon software.  Chart creation errors have been sought, but may not always  have been located. Such creation errors do not reflect on  the standard of medical care.

## 2022-03-17 ENCOUNTER — Encounter: Payer: Self-pay | Admitting: Physician Assistant

## 2022-03-17 ENCOUNTER — Other Ambulatory Visit: Payer: Self-pay

## 2022-03-17 ENCOUNTER — Ambulatory Visit: Payer: No Typology Code available for payment source | Attending: Physician Assistant | Admitting: Physician Assistant

## 2022-03-17 VITALS — BP 153/88 | HR 103 | Resp 17 | Ht 67.5 in | Wt 175.4 lb

## 2022-03-17 DIAGNOSIS — Z720 Tobacco use: Secondary | ICD-10-CM

## 2022-03-17 DIAGNOSIS — M0579 Rheumatoid arthritis with rheumatoid factor of multiple sites without organ or systems involvement: Secondary | ICD-10-CM

## 2022-03-17 DIAGNOSIS — M659 Synovitis and tenosynovitis, unspecified: Secondary | ICD-10-CM | POA: Diagnosis not present

## 2022-03-17 DIAGNOSIS — Z8261 Family history of arthritis: Secondary | ICD-10-CM

## 2022-03-17 DIAGNOSIS — Z79899 Other long term (current) drug therapy: Secondary | ICD-10-CM

## 2022-03-17 DIAGNOSIS — Z87442 Personal history of urinary calculi: Secondary | ICD-10-CM

## 2022-03-17 LAB — CBC WITH DIFFERENTIAL/PLATELET
Absolute Monocytes: 538 cells/uL (ref 200–950)
Basophils Absolute: 18 cells/uL (ref 0–200)
Basophils Relative: 0.4 %
Eosinophils Absolute: 78 cells/uL (ref 15–500)
Eosinophils Relative: 1.7 %
HCT: 44.7 % (ref 35.0–45.0)
Hemoglobin: 15.5 g/dL (ref 11.7–15.5)
Lymphs Abs: 1164 cells/uL (ref 850–3900)
MCH: 32.1 pg (ref 27.0–33.0)
MCHC: 34.7 g/dL (ref 32.0–36.0)
MCV: 92.5 fL (ref 80.0–100.0)
MPV: 8.9 fL (ref 7.5–12.5)
Monocytes Relative: 11.7 %
Neutro Abs: 2801 cells/uL (ref 1500–7800)
Neutrophils Relative %: 60.9 %
Platelets: 278 10*3/uL (ref 140–400)
RBC: 4.83 10*6/uL (ref 3.80–5.10)
RDW: 11.8 % (ref 11.0–15.0)
Total Lymphocyte: 25.3 %
WBC: 4.6 10*3/uL (ref 3.8–10.8)

## 2022-03-17 LAB — COMPLETE METABOLIC PANEL WITH GFR
AG Ratio: 2 (calc) (ref 1.0–2.5)
ALT: 14 U/L (ref 6–29)
AST: 20 U/L (ref 10–35)
Albumin: 4.6 g/dL (ref 3.6–5.1)
Alkaline phosphatase (APISO): 83 U/L (ref 37–153)
BUN: 7 mg/dL (ref 7–25)
CO2: 32 mmol/L (ref 20–32)
Calcium: 9.9 mg/dL (ref 8.6–10.4)
Chloride: 104 mmol/L (ref 98–110)
Creat: 0.94 mg/dL (ref 0.50–1.05)
Globulin: 2.3 g/dL (calc) (ref 1.9–3.7)
Glucose, Bld: 89 mg/dL (ref 65–99)
Potassium: 4.5 mmol/L (ref 3.5–5.3)
Sodium: 141 mmol/L (ref 135–146)
Total Bilirubin: 0.7 mg/dL (ref 0.2–1.2)
Total Protein: 6.9 g/dL (ref 6.1–8.1)
eGFR: 68 mL/min/{1.73_m2} (ref >=60)

## 2022-03-17 MED ORDER — HYDROXYCHLOROQUINE SULFATE 200 MG PO TABS: ORAL_TABLET | ORAL | 0 refills | Status: DC

## 2022-03-17 NOTE — Progress Notes (Unsigned)
Please review and sign no print plaquenil prescription to reflect dose change you advised today at patient's appointment. Thanks!

## 2022-03-18 NOTE — Progress Notes (Signed)
CBC and CMP are normal.

## 2022-04-25 ENCOUNTER — Other Ambulatory Visit: Payer: Self-pay | Admitting: Physician Assistant

## 2022-04-25 DIAGNOSIS — M0579 Rheumatoid arthritis with rheumatoid factor of multiple sites without organ or systems involvement: Secondary | ICD-10-CM

## 2022-04-25 DIAGNOSIS — Z79899 Other long term (current) drug therapy: Secondary | ICD-10-CM

## 2022-05-25 DIAGNOSIS — Z79899 Other long term (current) drug therapy: Secondary | ICD-10-CM

## 2022-05-25 DIAGNOSIS — M0579 Rheumatoid arthritis with rheumatoid factor of multiple sites without organ or systems involvement: Secondary | ICD-10-CM

## 2022-05-25 MED ORDER — HYDROXYCHLOROQUINE SULFATE 200 MG PO TABS: ORAL_TABLET | ORAL | 0 refills | Status: DC

## 2022-05-25 NOTE — Telephone Encounter (Signed)
Next Visit: 08/17/2022  Last Visit: 03/17/2022  Labs: 03/17/2022 CBC and CMP are normal.   Eye exam: 12/28/2021 WNL    Current Dose per office note 03/17/2022: Plaquenil 200 mg 1 tablet in the morning and 1/2 tablet in the evening.    [id number] arthritis involving multiple sites with positive rheumatoid factor   Okay to refill Plaquenil?

## 2022-08-03 NOTE — Progress Notes (Signed)
Office Visit Note  Patient: Amber Sampson             Date of Birth: 1961/01/03           MRN: [id number]             PCP: Lupita Raider, MD Referring: Lupita Raider, MD Visit Date: 08/17/2022 Occupation: @GUAROCC @  Subjective:  Medication management  History of Present Illness: Amber Sampson is a 62 y.o. female with history of seropositive rheumatoid arthritis.  She denies any increased joint pain or joint swelling.  She has been taking hydroxychloroquine 200 mg p.o. 1 tablet a.m. and half tablet p.m. on a daily basis.  Patient states that she quit smoking in February 2024.  She has gained some weight since then.    Activities of Daily Living:  Patient reports morning stiffness for 0 minutes.   Patient Denies nocturnal pain.  Difficulty dressing/grooming: Denies Difficulty climbing stairs: Denies Difficulty getting out of chair: Denies Difficulty using hands for taps, buttons, cutlery, and/or writing: Denies  Review of Systems  Constitutional:  Positive for fatigue.  HENT:  Negative for mouth sores and mouth dryness.   Eyes:  Negative for dryness.  Respiratory:  Negative for shortness of breath.   Cardiovascular:  Negative for chest pain and palpitations.  Gastrointestinal:  Negative for blood in stool, constipation and diarrhea.  Endocrine: Negative for increased urination.  Genitourinary:  Negative for involuntary urination.  Musculoskeletal:  Negative for joint pain, gait problem, joint pain, joint swelling, myalgias, muscle weakness, morning stiffness, muscle tenderness and myalgias.  Skin:  Negative for color change, rash, hair loss and sensitivity to sunlight.  Allergic/Immunologic: Negative for susceptible to infections.  Neurological:  Positive for headaches. Negative for dizziness.  Hematological:  Negative for swollen glands.  Psychiatric/Behavioral:  Positive for sleep disturbance. Negative for depressed mood. The patient is not nervous/anxious.      PMFS History:  Patient Active Problem List   Diagnosis Date Noted   Rheumatoid arthritis involving multiple sites with positive rheumatoid factor 11/09/2017   History of kidney stones 10/03/2017   Family history of rheumatoid arthritis 10/03/2017    Past Medical History:  Diagnosis Date   Carpal tunnel syndrome    Kidney stones    Rheumatoid arthritis     Family History  Problem Relation Age of Onset   Heart Problems Mother        pacemaker    Arthritis Father    Dementia Sister    Healthy Daughter    Healthy Daughter    Healthy Son    Past Surgical History:  Procedure Laterality Date   CESAREAN SECTION     CYSTOSCOPY  2018   DILATION AND CURETTAGE OF UTERUS     x6   HERNIA REPAIR     UTERINE SEPTUM RESECTION  1990s   Social History   Social History Narrative   Not on file   Immunization History  Administered Date(s) Administered   Influenza-Unspecified 01/31/2017   PFIZER(Purple Top)SARS-COV-2 Vaccination 07/20/2019, 08/10/2019, 02/22/2020   Pfizer Covid-19 Vaccine Bivalent Booster 14yrs & up 04/20/2021   Pneumococcal Conjugate-13 03/11/2018     Objective: Vital Signs: BP (!) 162/114 (BP Location: Right Wrist, Patient Position: Sitting, Cuff Size: Normal)   Pulse 78   Resp 14   Ht 5' 7.5" (1.715 m)   Wt 189 lb 6.4 oz (85.9 kg)   BMI 29.23 kg/m    Physical Exam Vitals and nursing note reviewed.  Constitutional:  Appearance: She is well-developed.  HENT:     Head: Normocephalic and atraumatic.  Eyes:     Conjunctiva/sclera: Conjunctivae normal.  Cardiovascular:     Rate and Rhythm: Normal rate and regular rhythm.     Heart sounds: Normal heart sounds.  Pulmonary:     Effort: Pulmonary effort is normal.     Breath sounds: Normal breath sounds.  Abdominal:     General: Bowel sounds are normal.     Palpations: Abdomen is soft.  Musculoskeletal:     Cervical back: Normal range of motion.  Lymphadenopathy:     Cervical: No cervical  adenopathy.  Skin:    General: Skin is warm and dry.     Capillary Refill: Capillary refill takes less than 2 seconds.  Neurological:     Mental Status: She is alert and oriented to person, place, and time.  Psychiatric:        Behavior: Behavior normal.      Musculoskeletal Exam: Cervical, thoracic and lumbar spine were in good range of motion.  Shoulder joints, elbow joints, wrist joints, MCPs PIPs and DIPs were in good range of motion with no synovitis.  Hip joints, knee joints, ankles, MTPs and PIPs were in good range of motion with no synovitis.  CDAI Exam: CDAI Score: -- Patient Global: 0 mm; Provider Global: 0 mm Swollen: --; Tender: -- Joint Exam 08/17/2022   No joint exam has been documented for this visit   There is currently no information documented on the homunculus. Go to the Rheumatology activity and complete the homunculus joint exam.  Investigation: No additional findings.  Imaging: No results found.  Recent Labs: Lab Results  Component Value Date   WBC 4.6 03/17/2022   HGB 15.5 03/17/2022   PLT 278 03/17/2022   NA 141 03/17/2022   K 4.5 03/17/2022   CL 104 03/17/2022   CO2 32 03/17/2022   GLUCOSE 89 03/17/2022   BUN 7 03/17/2022   CREATININE 0.94 03/17/2022   BILITOT 0.7 03/17/2022   AST 20 03/17/2022   ALT 14 03/17/2022   PROT 6.9 03/17/2022   CALCIUM 9.9 03/17/2022   GFRAA 81 07/07/2020    Speciality Comments: PLQ Eye Exam: 12/28/2021 WNL @ [facility] Follow up 1 year  Procedures:  No procedures performed Allergies: Levaquin [levofloxacin in d5w] and Sulfa antibiotics   Assessment / Plan:     Visit Diagnoses: Rheumatoid arthritis involving multiple sites with positive rheumatoid factor - RF positive, anti-CCP positive: -Patient had no synovitis on examination.  All joints were in full range of motion.  She has been tolerating hydroxychloroquine 200 mg p.o. tablet a.m. and half tablet p.m. without any side effects.  Per patient's  request prescription refill was given today.  I do not have x-rays in the system and will obtain x-rays today.  Plan: hydroxychloroquine (PLAQUENIL) 200 MG tablet, XR Hand 2 View Right, XR Hand 2 View Left, XR Foot 2 Views Right, XR Foot 2 Views Left.  X-rays showed early degenerative changes.  High risk medication use - Plaquenil 200 mg 1 tablet in the morning and 1/2 tablet in the evening.  Plaquenil eye examination was December 28, 2021.  Annual eye examination to monitor for ocular toxicity was advised.-Labs obtained on March 17, 2022 CBC and CMP were normal.  I will obtain labs today.  Plan: CBC with Differential/Platelet, COMPLETE METABOLIC PANEL WITH GFR  Elevated blood pressure reading-blood pressure was elevated at 162/114.  Patient states that she has  whitecoat syndrome.   Family history of rheumatoid arthritis  History of kidney stones-she had no recent episodes of renal calcinosis.  Former smoker - quit 06/26/22. Smoked 1/2 PPD X 40 years.  Patient has gained weight since she quit smoking.  Dietary modifications and exercise were emphasized.  Association of heart disease with rheumatoid arthritis was also discussed.  Orders: Orders Placed This Encounter  Procedures   XR Hand 2 View Right   XR Hand 2 View Left   XR Foot 2 Views Right   XR Foot 2 Views Left   CBC with Differential/Platelet   COMPLETE METABOLIC PANEL WITH GFR   Meds ordered this encounter  Medications   hydroxychloroquine (PLAQUENIL) 200 MG tablet    Sig: Take 1 tablet in the morning and a half of tablet in the evening.    Dispense:  135 tablet    Refill:  0     Follow-Up Instructions: Return in about 5 months (around 01/17/2023) for Rheumatoid arthritis.   Pollyann Savoy, MD  Note - This record has been created using Animal nutritionist.  Chart creation errors have been sought, but may not always  have been located. Such creation errors do not reflect on  the standard of medical care.,

## 2022-08-17 ENCOUNTER — Encounter: Payer: Self-pay | Admitting: Rheumatology

## 2022-08-17 ENCOUNTER — Ambulatory Visit: Payer: 59 | Attending: Rheumatology | Admitting: Rheumatology

## 2022-08-17 ENCOUNTER — Ambulatory Visit: Payer: 59

## 2022-08-17 ENCOUNTER — Ambulatory Visit (INDEPENDENT_AMBULATORY_CARE_PROVIDER_SITE_OTHER): Payer: 59

## 2022-08-17 VITALS — BP 152/96 | HR 73 | Resp 14 | Ht 67.5 in | Wt 189.4 lb

## 2022-08-17 DIAGNOSIS — M79672 Pain in left foot: Secondary | ICD-10-CM

## 2022-08-17 DIAGNOSIS — Z79899 Other long term (current) drug therapy: Secondary | ICD-10-CM | POA: Diagnosis not present

## 2022-08-17 DIAGNOSIS — Z8261 Family history of arthritis: Secondary | ICD-10-CM | POA: Diagnosis not present

## 2022-08-17 DIAGNOSIS — M0579 Rheumatoid arthritis with rheumatoid factor of multiple sites without organ or systems involvement: Secondary | ICD-10-CM

## 2022-08-17 DIAGNOSIS — M79671 Pain in right foot: Secondary | ICD-10-CM | POA: Diagnosis not present

## 2022-08-17 DIAGNOSIS — M79642 Pain in left hand: Secondary | ICD-10-CM | POA: Diagnosis not present

## 2022-08-17 DIAGNOSIS — Z720 Tobacco use: Secondary | ICD-10-CM

## 2022-08-17 DIAGNOSIS — M79641 Pain in right hand: Secondary | ICD-10-CM

## 2022-08-17 DIAGNOSIS — Z87442 Personal history of urinary calculi: Secondary | ICD-10-CM | POA: Diagnosis not present

## 2022-08-17 DIAGNOSIS — M659 Synovitis and tenosynovitis, unspecified: Secondary | ICD-10-CM

## 2022-08-17 DIAGNOSIS — R030 Elevated blood-pressure reading, without diagnosis of hypertension: Secondary | ICD-10-CM

## 2022-08-17 DIAGNOSIS — Z87891 Personal history of nicotine dependence: Secondary | ICD-10-CM

## 2022-08-17 MED ORDER — HYDROXYCHLOROQUINE SULFATE 200 MG PO TABS: ORAL_TABLET | ORAL | 0 refills | Status: DC

## 2022-08-17 NOTE — Patient Instructions (Addendum)
Vaccines °You are taking a medication(s) that can suppress your immune system.  The following immunizations are recommended: °Flu annually °Covid-19  °Td/Tdap (tetanus, diphtheria, pertussis) every 10 years °Pneumonia (Prevnar 15 then Pneumovax 23 at least 1 year apart.  Alternatively, can take Prevnar 20 without needing additional dose) °Shingrix: 2 doses from 4 weeks to 6 months apart ° °Please check with your PCP to make sure you are up to date.  ° °Heart Disease Prevention  ° °Your inflammatory disease increases your risk of heart disease which includes heart attack, stroke, atrial fibrillation (irregular heartbeats), high blood pressure, heart failure and atherosclerosis (plaque in the arteries).  It is important to reduce your risk by:  ° °Keep blood pressure, cholesterol, and blood sugar at healthy levels  ° °Smoking Cessation  ° °Maintain a healthy weight  °BMI 20-25  ° °Eat a healthy diet  °Plenty of fresh fruit, vegetables, and whole grains  °Limit saturated fats, foods high in sodium, and added sugars  °DASH and Mediterranean diet  ° °Increase physical activity  °Recommend moderate physically activity for 150 minutes per week/ 30 minutes a day for five days a week These can be broken up into three separate ten-minute sessions during the day.  ° °Reduce Stress  °Meditation, slow breathing exercises, yoga, coloring books  °Dental visits twice a year   °

## 2022-08-18 LAB — CBC WITH DIFFERENTIAL/PLATELET
Absolute Monocytes: 546 cells/uL (ref 200–950)
Basophils Absolute: 31 cells/uL (ref 0–200)
Basophils Relative: 0.6 %
Eosinophils Absolute: 92 cells/uL (ref 15–500)
Eosinophils Relative: 1.8 %
HCT: 45.4 % — ABNORMAL HIGH (ref 35.0–45.0)
Hemoglobin: 15.1 g/dL (ref 11.7–15.5)
Lymphs Abs: 1550 cells/uL (ref 850–3900)
MCH: 31.1 pg (ref 27.0–33.0)
MCHC: 33.3 g/dL (ref 32.0–36.0)
MCV: 93.4 fL (ref 80.0–100.0)
MPV: 8.9 fL (ref 7.5–12.5)
Monocytes Relative: 10.7 %
Neutro Abs: 2882 cells/uL (ref 1500–7800)
Neutrophils Relative %: 56.5 %
Platelets: 284 10*3/uL (ref 140–400)
RBC: 4.86 10*6/uL (ref 3.80–5.10)
RDW: 12.2 % (ref 11.0–15.0)
Total Lymphocyte: 30.4 %
WBC: 5.1 10*3/uL (ref 3.8–10.8)

## 2022-08-18 LAB — COMPLETE METABOLIC PANEL WITH GFR
AG Ratio: 1.8 (calc) (ref 1.0–2.5)
ALT: 21 U/L (ref 6–29)
AST: 22 U/L (ref 10–35)
Albumin: 4.5 g/dL (ref 3.6–5.1)
Alkaline phosphatase (APISO): 85 U/L (ref 37–153)
BUN: 15 mg/dL (ref 7–25)
CO2: 28 mmol/L (ref 20–32)
Calcium: 9.7 mg/dL (ref 8.6–10.4)
Chloride: 105 mmol/L (ref 98–110)
Creat: 0.96 mg/dL (ref 0.50–1.05)
Globulin: 2.5 g/dL (calc) (ref 1.9–3.7)
Glucose, Bld: 94 mg/dL (ref 65–139)
Potassium: 5.4 mmol/L — ABNORMAL HIGH (ref 3.5–5.3)
Sodium: 141 mmol/L (ref 135–146)
Total Bilirubin: 0.5 mg/dL (ref 0.2–1.2)
Total Protein: 7 g/dL (ref 6.1–8.1)
eGFR: 66 mL/min/{1.73_m2} (ref >=60)

## 2022-08-18 NOTE — Progress Notes (Signed)
CBC and CMP are stable.  Potassium is high, probably hemolyzed sample.  Please advise patient to avoid all potassium supplements.

## 2022-12-02 ENCOUNTER — Other Ambulatory Visit: Payer: Self-pay | Admitting: Rheumatology

## 2022-12-02 DIAGNOSIS — M0579 Rheumatoid arthritis with rheumatoid factor of multiple sites without organ or systems involvement: Secondary | ICD-10-CM

## 2022-12-02 NOTE — Telephone Encounter (Signed)
Last Fill: 08/17/2022  Eye exam:  12/28/2021 WNL    Labs: 08/17/2022 CBC and CMP are stable.  Potassium is high, probably hemolyzed sample.   Next Visit: 01/17/2023  Last Visit: 08/17/2022  XB:[id number] arthritis involving multiple sites with positive rheumatoid factor   Current Dose per office note 08/17/2022: Plaquenil 200 mg 1 tablet in the morning and 1/2 tablet in the evening.   Okay to refill Plaquenil?

## 2023-01-04 NOTE — Progress Notes (Signed)
Office Visit Note  Patient: Amber Sampson             Date of Birth: [birth year]           MRN: [id number]             PCP: Lupita Raider, MD Referring: Lupita Raider, MD Visit Date: 01/17/2023 Occupation: @GUAROCC @  Subjective:  Medication monitoring   History of Present Illness: Amber Sampson is a 63 y.o. female with history of seropositive rheumatoid arthritis.  Patient remains on Plaquenil 200 mg 1 tablet in the morning and 1/2 tablet in the evening.  She is tolerating plaquenil without any side effects.  She has not missed any doses recently.  She denies any signs or symptoms of a rheumatoid arthritis flare.  She has not had any morning stiffness, nocturnal pain, or difficulty with ADLs.  She denies any joint swelling at this time.  She denies any new medical conditions.  She denies any recent infections.     Activities of Daily Living:  Patient reports morning stiffness for 0 minute.   Patient Denies nocturnal pain.  Difficulty dressing/grooming: Denies Difficulty climbing stairs: Denies Difficulty getting out of chair: Denies Difficulty using hands for taps, buttons, cutlery, and/or writing: Denies  Review of Systems  Constitutional:  Negative for fatigue.  HENT:  Negative for mouth sores and mouth dryness.   Eyes:  Negative for dryness.  Respiratory:  Negative for shortness of breath.   Cardiovascular:  Negative for chest pain and palpitations.  Gastrointestinal:  Negative for blood in stool, constipation and diarrhea.  Endocrine: Negative for increased urination.  Genitourinary:  Negative for involuntary urination.  Musculoskeletal:  Negative for joint pain, gait problem, joint pain, joint swelling, myalgias, muscle weakness, morning stiffness, muscle tenderness and myalgias.  Skin:  Negative for color change, rash, hair loss and sensitivity to sunlight.  Allergic/Immunologic: Negative for susceptible to infections.  Neurological:  Positive for headaches.  Negative for dizziness.  Hematological:  Negative for swollen glands.  Psychiatric/Behavioral:  Positive for sleep disturbance. Negative for depressed mood. The patient is not nervous/anxious.     PMFS History:  Patient Active Problem List   Diagnosis Date Noted   Rheumatoid arthritis involving multiple sites with positive rheumatoid factor (HCC) 11/09/2017   History of kidney stones 10/03/2017   Family history of rheumatoid arthritis 10/03/2017    Past Medical History:  Diagnosis Date   Carpal tunnel syndrome    Kidney stones    Rheumatoid arthritis (HCC)     Family History  Problem Relation Age of Onset   Heart Problems Mother        pacemaker    Arthritis Father    Dementia Sister    Healthy Daughter    Healthy Daughter    Healthy Son    Past Surgical History:  Procedure Laterality Date   CESAREAN SECTION     CYSTOSCOPY  2018   DILATION AND CURETTAGE OF UTERUS     x6   HERNIA REPAIR     UTERINE SEPTUM RESECTION  1990s   Social History   Social History Narrative   Not on file   Immunization History  Administered Date(s) Administered   Influenza-Unspecified 01/31/2017   PFIZER(Purple Top)SARS-COV-2 Vaccination 07/20/2019, 08/10/2019, 02/22/2020   Pfizer Covid-19 Vaccine Bivalent Booster 69yrs & up 04/20/2021   Pneumococcal Conjugate-13 03/11/2018     Objective: Vital Signs: BP (!) 138/90 (BP Location: Left Arm, Patient Position: Sitting, Cuff Size: Normal)   Pulse 83  Resp 12   Ht 5' 7.5" (1.715 m)   Wt 186 lb (84.4 kg)   BMI 28.70 kg/m    Physical Exam Vitals and nursing note reviewed.  Constitutional:      Appearance: She is well-developed.  HENT:     Head: Normocephalic and atraumatic.  Eyes:     Conjunctiva/sclera: Conjunctivae normal.  Cardiovascular:     Rate and Rhythm: Normal rate and regular rhythm.     Heart sounds: Normal heart sounds.  Pulmonary:     Effort: Pulmonary effort is normal.     Breath sounds: Normal breath sounds.   Abdominal:     General: Bowel sounds are normal.     Palpations: Abdomen is soft.  Musculoskeletal:     Cervical back: Normal range of motion.  Lymphadenopathy:     Cervical: No cervical adenopathy.  Skin:    General: Skin is warm and dry.     Capillary Refill: Capillary refill takes less than 2 seconds.  Neurological:     Mental Status: She is alert and oriented to person, place, and time.  Psychiatric:        Behavior: Behavior normal.      Musculoskeletal Exam: C-spine, thoracic spine, lumbar spine have good range of motion.  Shoulder joints, elbow joints, wrist joints, MCPs, PIPs, DIPs have good range of motion with no synovitis.  Complete fist formation bilaterally.  Hip joints have good range of motion with no groin pain.  Knee joints have good range of motion with no warmth or effusion.  Ankle joints have good range of motion with no tenderness or joint swelling.  No tenderness or synovitis over MTP joints.  CDAI Exam: CDAI Score: -- Patient Global: 0 / 100; Provider Global: 0 / 100 Swollen: --; Tender: -- Joint Exam 01/17/2023   No joint exam has been documented for this visit   There is currently no information documented on the homunculus. Go to the Rheumatology activity and complete the homunculus joint exam.  Investigation: No additional findings.  Imaging: No results found.  Recent Labs: Lab Results  Component Value Date   WBC 5.1 08/17/2022   HGB 15.1 08/17/2022   PLT 284 08/17/2022   NA 141 08/17/2022   K 5.4 (H) 08/17/2022   CL 105 08/17/2022   CO2 28 08/17/2022   GLUCOSE 94 08/17/2022   BUN 15 08/17/2022   CREATININE 0.96 08/17/2022   BILITOT 0.5 08/17/2022   AST 22 08/17/2022   ALT 21 08/17/2022   PROT 7.0 08/17/2022   CALCIUM 9.7 08/17/2022   GFRAA 81 07/07/2020    Speciality Comments: PLQ Eye Exam: 01/10/2023 WNL @ [facility] Follow up 1 year  Procedures:  No procedures performed Allergies: Levaquin [levofloxacin in d5w] and  Sulfa antibiotics    Assessment / Plan:     Visit Diagnoses: Rheumatoid arthritis involving multiple sites with positive rheumatoid factor (HCC) - RF positive, anti-CCP positive: She has no joint tenderness or synovitis on examination today.  She has not had any signs or symptoms of a rheumatoid arthritis flare.  She has clinically been doing well taking Plaquenil 200 mg 1 tab in the morning and half tablet in the evening.  She continues to tolerate Plaquenil without any side effects or interruptions in therapy.  X-rays of both hands and feet were reviewed from her last office visit on 08/17/22: Findings consistent with osteoarthritis.  No erosive changes noted.  She has not been experiencing any morning stiffness, nocturnal pain, or  difficulty with ADLs.  She will remain on Plaquenil as monotherapy.  CBC and CMP will be updated today to monitor for drug toxicity.  She was advised to notify us if she develops signs or symptoms of a flare.  She will follow-up in the office in 5 months or sooner if needed. - Plan: hydroxychloroquine (PLAQUENIL) 200 MG tablet  High risk medication use - Plaquenil 200 mg 1 tablet in the morning and 1/2 tablet in the evening.   PLQ Eye Exam: 01/10/2023 WNL @ [facility] Follow up 1 year  CBC and CMP updated on 08/17/22.  Orders for CBC and CMP released today.  - Plan: COMPLETE METABOLIC PANEL WITH GFR, CBC with Differential/Platelet  Family history of rheumatoid arthritis  History of kidney stones  Former smoker - quit 06/26/22. Smoked 1/2 PPD X 40 years.  Orders: Orders Placed This Encounter  Procedures   COMPLETE METABOLIC PANEL WITH GFR   CBC with Differential/Platelet   Meds ordered this encounter  Medications   hydroxychloroquine (PLAQUENIL) 200 MG tablet    Sig: Take 1 tablet in the morning and a half of tablet in the evening.    Dispense:  135 tablet    Refill:  0     Follow-Up Instructions: Return in about 5 months (around 06/19/2023) for  Rheumatoid arthritis.   Gearldine Bienenstock, PA-C  Note - This record has been created using Dragon software.  Chart creation errors have been sought, but may not always  have been located. Such creation errors do not reflect on  the standard of medical care.

## 2023-01-17 ENCOUNTER — Ambulatory Visit: Payer: 59 | Attending: Physician Assistant | Admitting: Physician Assistant

## 2023-01-17 ENCOUNTER — Encounter: Payer: Self-pay | Admitting: Physician Assistant

## 2023-01-17 VITALS — BP 138/90 | HR 83 | Resp 12 | Ht 67.5 in | Wt 186.0 lb

## 2023-01-17 DIAGNOSIS — M0579 Rheumatoid arthritis with rheumatoid factor of multiple sites without organ or systems involvement: Secondary | ICD-10-CM | POA: Diagnosis not present

## 2023-01-17 DIAGNOSIS — Z79899 Other long term (current) drug therapy: Secondary | ICD-10-CM

## 2023-01-17 DIAGNOSIS — Z87442 Personal history of urinary calculi: Secondary | ICD-10-CM | POA: Diagnosis not present

## 2023-01-17 DIAGNOSIS — Z8261 Family history of arthritis: Secondary | ICD-10-CM | POA: Diagnosis not present

## 2023-01-17 DIAGNOSIS — Z87891 Personal history of nicotine dependence: Secondary | ICD-10-CM

## 2023-01-17 MED ORDER — HYDROXYCHLOROQUINE SULFATE 200 MG PO TABS
ORAL_TABLET | ORAL | 0 refills | Status: DC
Start: 2023-01-17 — End: 2023-06-13

## 2023-01-18 LAB — CBC WITH DIFFERENTIAL/PLATELET
Absolute Monocytes: 497 {cells}/uL (ref 200–950)
Basophils Absolute: 22 {cells}/uL (ref 0–200)
Basophils Relative: 0.4 %
Eosinophils Absolute: 81 {cells}/uL (ref 15–500)
Eosinophils Relative: 1.5 %
HCT: 46 % — ABNORMAL HIGH (ref 35.0–45.0)
Hemoglobin: 15.4 g/dL (ref 11.7–15.5)
Lymphs Abs: 1528 {cells}/uL (ref 850–3900)
MCH: 31.2 pg (ref 27.0–33.0)
MCHC: 33.5 g/dL (ref 32.0–36.0)
MCV: 93.1 fL (ref 80.0–100.0)
MPV: 9.3 fL (ref 7.5–12.5)
Monocytes Relative: 9.2 %
Neutro Abs: 3272 {cells}/uL (ref 1500–7800)
Neutrophils Relative %: 60.6 %
Platelets: 274 10*3/uL (ref 140–400)
RBC: 4.94 10*6/uL (ref 3.80–5.10)
RDW: 12.1 % (ref 11.0–15.0)
Total Lymphocyte: 28.3 %
WBC: 5.4 10*3/uL (ref 3.8–10.8)

## 2023-01-18 LAB — COMPLETE METABOLIC PANEL WITH GFR
AG Ratio: 2 (calc) (ref 1.0–2.5)
ALT: 15 U/L (ref 6–29)
AST: 19 U/L (ref 10–35)
Albumin: 4.5 g/dL (ref 3.6–5.1)
Alkaline phosphatase (APISO): 77 U/L (ref 37–153)
BUN: 13 mg/dL (ref 7–25)
CO2: 28 mmol/L (ref 20–32)
Calcium: 9.6 mg/dL (ref 8.6–10.4)
Chloride: 104 mmol/L (ref 98–110)
Creat: 0.87 mg/dL (ref 0.50–1.05)
Globulin: 2.3 g/dL (ref 1.9–3.7)
Glucose, Bld: 94 mg/dL (ref 65–139)
Potassium: 4.9 mmol/L (ref 3.5–5.3)
Sodium: 142 mmol/L (ref 135–146)
Total Bilirubin: 0.5 mg/dL (ref 0.2–1.2)
Total Protein: 6.8 g/dL (ref 6.1–8.1)
eGFR: 75 mL/min/{1.73_m2} (ref >=60)

## 2023-01-18 NOTE — Progress Notes (Signed)
CMP WNL Hematocrit is borderline elevated. Rest of CBC WNL.  We will continue to monitor.

## 2023-06-07 NOTE — Progress Notes (Deleted)
 Office Visit Note  Patient: Amber Sampson             Date of Birth: 1960-07-27           MRN: [id number]             PCP: Lupita Raider, MD Referring: Lupita Raider, MD Visit Date: 06/21/2023 Occupation: @GUAROCC @  Subjective:  No chief complaint on file.   History of Present Illness: Amber Sampson is a 63 y.o. female ***     Activities of Daily Living:  Patient reports morning stiffness for *** {minute/hour:19697}.   Patient {ACTIONS;DENIES/REPORTS:21021675::"Denies"} nocturnal pain.  Difficulty dressing/grooming: {ACTIONS;DENIES/REPORTS:21021675::"Denies"} Difficulty climbing stairs: {ACTIONS;DENIES/REPORTS:21021675::"Denies"} Difficulty getting out of chair: {ACTIONS;DENIES/REPORTS:21021675::"Denies"} Difficulty using hands for taps, buttons, cutlery, and/or writing: {ACTIONS;DENIES/REPORTS:21021675::"Denies"}  No Rheumatology ROS completed.   PMFS History:  Patient Active Problem List   Diagnosis Date Noted   Rheumatoid arthritis involving multiple sites with positive rheumatoid factor (HCC) 11/09/2017   History of kidney stones 10/03/2017   Family history of rheumatoid arthritis 10/03/2017    Past Medical History:  Diagnosis Date   Carpal tunnel syndrome    Kidney stones    Rheumatoid arthritis (HCC)     Family History  Problem Relation Age of Onset   Heart Problems Mother        pacemaker    Arthritis Father    Dementia Sister    Healthy Daughter    Healthy Daughter    Healthy Son    Past Surgical History:  Procedure Laterality Date   CESAREAN SECTION     CYSTOSCOPY  2018   DILATION AND CURETTAGE OF UTERUS     x6   HERNIA REPAIR     UTERINE SEPTUM RESECTION  1990s   Social History   Social History Narrative   Not on file   Immunization History  Administered Date(s) Administered   Influenza-Unspecified 01/31/2017   PFIZER(Purple Top)SARS-COV-2 Vaccination 07/20/2019, 08/10/2019, 02/22/2020   Pfizer Covid-19 Vaccine Bivalent Booster  20yrs & up 04/20/2021   Pneumococcal Conjugate-13 03/11/2018     Objective: Vital Signs: There were no vitals taken for this visit.   Physical Exam   Musculoskeletal Exam: ***  CDAI Exam: CDAI Score: -- Patient Global: --; Provider Global: -- Swollen: --; Tender: -- Joint Exam 06/21/2023   No joint exam has been documented for this visit   There is currently no information documented on the homunculus. Go to the Rheumatology activity and complete the homunculus joint exam.  Investigation: No additional findings.  Imaging: No results found.  Recent Labs: Lab Results  Component Value Date   WBC 5.4 01/17/2023   HGB 15.4 01/17/2023   PLT 274 01/17/2023   NA 142 01/17/2023   K 4.9 01/17/2023   CL 104 01/17/2023   CO2 28 01/17/2023   GLUCOSE 94 01/17/2023   BUN 13 01/17/2023   CREATININE 0.87 01/17/2023   BILITOT 0.5 01/17/2023   AST 19 01/17/2023   ALT 15 01/17/2023   PROT 6.8 01/17/2023   CALCIUM 9.6 01/17/2023   GFRAA 81 07/07/2020    Speciality Comments: PLQ Eye Exam: 01/10/2023 WNL @ [facility] Follow up 1 year  Procedures:  No procedures performed Allergies: Levaquin [levofloxacin in d5w] and Sulfa antibiotics   Assessment / Plan:     Visit Diagnoses: No diagnosis found.  Orders: No orders of the defined types were placed in this encounter.  No orders of the defined types were placed in this encounter.   Face-to-face time  spent with patient was *** minutes. Greater than 50% of time was spent in counseling and coordination of care.  Follow-Up Instructions: No follow-ups on file.   Ellen Henri, CMA  Note - This record has been created using Animal nutritionist.  Chart creation errors have been sought, but may not always  have been located. Such creation errors do not reflect on  the standard of medical care.

## 2023-06-12 ENCOUNTER — Other Ambulatory Visit: Payer: Self-pay | Admitting: Physician Assistant

## 2023-06-12 DIAGNOSIS — M0579 Rheumatoid arthritis with rheumatoid factor of multiple sites without organ or systems involvement: Secondary | ICD-10-CM

## 2023-06-13 NOTE — Telephone Encounter (Signed)
 Last Fill: 01/17/2023  Eye exam: 01/10/2023 WNL    Labs: 01/17/2023 CMP WNL Hematocrit is borderline elevated. Rest of CBC WNL.   Next Visit: 06/21/2023  Last Visit: 01/17/2023  DX: Rheumatoid arthritis involving multiple sites with positive rheumatoid factor   Current Dose per office note 01/17/2023: Plaquenil  200 mg 1 tablet in the morning and 1/2 tablet in the evening.    Okay to refill Plaquenil ?

## 2023-06-21 ENCOUNTER — Ambulatory Visit: Payer: 59 | Admitting: Rheumatology

## 2023-06-21 DIAGNOSIS — Z87442 Personal history of urinary calculi: Secondary | ICD-10-CM

## 2023-06-21 DIAGNOSIS — Z79899 Other long term (current) drug therapy: Secondary | ICD-10-CM

## 2023-06-21 DIAGNOSIS — M0579 Rheumatoid arthritis with rheumatoid factor of multiple sites without organ or systems involvement: Secondary | ICD-10-CM

## 2023-06-21 DIAGNOSIS — Z87891 Personal history of nicotine dependence: Secondary | ICD-10-CM

## 2023-06-21 DIAGNOSIS — Z8261 Family history of arthritis: Secondary | ICD-10-CM

## 2023-06-30 NOTE — Progress Notes (Deleted)
 Office Visit Note  Patient: Amber Sampson             Date of Birth: [birth year]           MRN: [id number]             PCP: Lupita Raider, MD Referring: Lupita Raider, MD Visit Date: 07/14/2023 Occupation: @GUAROCC @  Subjective:  No chief complaint on file.   History of Present Illness: Amber Sampson is a 63 y.o. female ***     Activities of Daily Living:  Patient reports morning stiffness for *** {minute/hour:19697}.   Patient {ACTIONS;DENIES/REPORTS:21021675::"Denies"} nocturnal pain.  Difficulty dressing/grooming: {ACTIONS;DENIES/REPORTS:21021675::"Denies"} Difficulty climbing stairs: {ACTIONS;DENIES/REPORTS:21021675::"Denies"} Difficulty getting out of chair: {ACTIONS;DENIES/REPORTS:21021675::"Denies"} Difficulty using hands for taps, buttons, cutlery, and/or writing: {ACTIONS;DENIES/REPORTS:21021675::"Denies"}  No Rheumatology ROS completed.   PMFS History:  Patient Active Problem List   Diagnosis Date Noted   Rheumatoid arthritis involving multiple sites with positive rheumatoid factor (HCC) 11/09/2017   History of kidney stones 10/03/2017   Family history of rheumatoid arthritis 10/03/2017    Past Medical History:  Diagnosis Date   Carpal tunnel syndrome    Kidney stones    Rheumatoid arthritis (HCC)     Family History  Problem Relation Age of Onset   Heart Problems Mother        pacemaker    Arthritis Father    Dementia Sister    Healthy Daughter    Healthy Daughter    Healthy Son    Past Surgical History:  Procedure Laterality Date   CESAREAN SECTION     CYSTOSCOPY  2018   DILATION AND CURETTAGE OF UTERUS     x6   HERNIA REPAIR     UTERINE SEPTUM RESECTION  1990s   Social History   Social History Narrative   Not on file   Immunization History  Administered Date(s) Administered   Influenza-Unspecified 01/31/2017   PFIZER(Purple Top)SARS-COV-2 Vaccination 07/20/2019, 08/10/2019, 02/22/2020   Pfizer Covid-19 Vaccine Bivalent Booster  50yrs & up 04/20/2021   Pneumococcal Conjugate-13 03/11/2018     Objective: Vital Signs: There were no vitals taken for this visit.   Physical Exam   Musculoskeletal Exam: ***  CDAI Exam: CDAI Score: -- Patient Global: --; Provider Global: -- Swollen: --; Tender: -- Joint Exam 07/14/2023   No joint exam has been documented for this visit   There is currently no information documented on the homunculus. Go to the Rheumatology activity and complete the homunculus joint exam.  Investigation: No additional findings.  Imaging: No results found.  Recent Labs: Lab Results  Component Value Date   WBC 5.4 01/17/2023   HGB 15.4 01/17/2023   PLT 274 01/17/2023   NA 142 01/17/2023   K 4.9 01/17/2023   CL 104 01/17/2023   CO2 28 01/17/2023   GLUCOSE 94 01/17/2023   BUN 13 01/17/2023   CREATININE 0.87 01/17/2023   BILITOT 0.5 01/17/2023   AST 19 01/17/2023   ALT 15 01/17/2023   PROT 6.8 01/17/2023   CALCIUM 9.6 01/17/2023   GFRAA 81 07/07/2020    Speciality Comments: PLQ Eye Exam: 01/10/2023 WNL @ [facility] Follow up 1 year  Procedures:  No procedures performed Allergies: Levaquin [levofloxacin in d5w] and Sulfa antibiotics   Assessment / Plan:     Visit Diagnoses: Rheumatoid arthritis involving multiple sites with positive rheumatoid factor (HCC)  High risk medication use  Tenosynovitis of right wrist  Family history of rheumatoid arthritis  History of kidney stones  Former smoker  Tobacco use  Orders: No orders of the defined types were placed in this encounter.  No orders of the defined types were placed in this encounter.   Face-to-face time spent with patient was *** minutes. Greater than 50% of time was spent in counseling and coordination of care.  Follow-Up Instructions: No follow-ups on file.   Gearldine Bienenstock, PA-C  Note - This record has been created using Dragon software.  Chart creation errors have been sought, but may not  always  have been located. Such creation errors do not reflect on  the standard of medical care.

## 2023-07-13 NOTE — Progress Notes (Unsigned)
 Office Visit Note  Patient: Amber Sampson             Date of Birth: 1961/03/08           MRN: [id number]             PCP: Lupita Raider, MD Referring: Lupita Raider, MD Visit Date: 07/27/2023 Occupation: @GUAROCC @  Subjective:  Medication monitoring  History of Present Illness: Amber Sampson is a 63 y.o. female with history of seropositive rheumatoid arthritis and osteoarthritis.  Patient remains on  Plaquenil 200 mg 1 tablet in the morning and 1/2 tablet in the evening.  She is tolerating Plaquenil without any side effects and has not had any recent gaps in therapy.  She denies any signs or symptoms of a rheumatoid arthritis flare.  She denies any morning stiffness, nocturnal pain, or difficulty with ADLs.  She has not had any joint swelling.  She has been able to do the activities that she enjoys without difficulty.  Activities of Daily Living:  Patient reports morning stiffness for 0 minutes.   Patient Denies nocturnal pain.  Difficulty dressing/grooming: Denies Difficulty climbing stairs: Denies Difficulty getting out of chair: Denies Difficulty using hands for taps, buttons, cutlery, and/or writing: Denies  Review of Systems  Constitutional:  Positive for night sweats. Negative for fatigue.  HENT:  Negative for mouth sores, mouth dryness and nose dryness.   Eyes:  Negative for pain and dryness.  Respiratory:  Negative for shortness of breath and difficulty breathing.   Cardiovascular:  Negative for chest pain and palpitations.  Gastrointestinal:  Negative for blood in stool, constipation and diarrhea.  Endocrine: Negative for increased urination.  Genitourinary:  Negative for involuntary urination.  Musculoskeletal:  Negative for joint pain, gait problem, joint pain, joint swelling, myalgias, muscle weakness, morning stiffness, muscle tenderness and myalgias.  Skin:  Negative for color change, rash, hair loss and sensitivity to sunlight.  Allergic/Immunologic:  Negative for susceptible to infections.  Neurological:  Positive for night sweats. Negative for dizziness and headaches.  Hematological:  Negative for swollen glands.  Psychiatric/Behavioral:  Positive for sleep disturbance. Negative for depressed mood. The patient is not nervous/anxious.     PMFS History:  Patient Active Problem List   Diagnosis Date Noted   Rheumatoid arthritis involving multiple sites with positive rheumatoid factor (HCC) 11/09/2017   History of kidney stones 10/03/2017   Family history of rheumatoid arthritis 10/03/2017    Past Medical History:  Diagnosis Date   Carpal tunnel syndrome    Kidney stones    Rheumatoid arthritis (HCC)     Family History  Problem Relation Age of Onset   Heart Problems Mother        pacemaker    Arthritis Father    Dementia Sister    Healthy Daughter    Healthy Daughter    Healthy Son    Past Surgical History:  Procedure Laterality Date   CESAREAN SECTION     CYSTOSCOPY  2018   DILATION AND CURETTAGE OF UTERUS     x6   HERNIA REPAIR     UTERINE SEPTUM RESECTION  1990s   Social History   Social History Narrative   Not on file   Immunization History  Administered Date(s) Administered   Influenza-Unspecified 01/31/2017   PFIZER(Purple Top)SARS-COV-2 Vaccination 07/20/2019, 08/10/2019, 02/22/2020   Pfizer Covid-19 Vaccine Bivalent Booster 46yrs & up 04/20/2021   Pneumococcal Conjugate-13 03/11/2018     Objective: Vital Signs: BP (!) 159/85 (BP Location:  Left Arm, Patient Position: Sitting, Cuff Size: Normal)   Pulse 93   Resp 16   Ht 5' 7.5" (1.715 m)   Wt 180 lb (81.6 kg)   BMI 27.78 kg/m    Physical Exam Vitals and nursing note reviewed.  Constitutional:      Appearance: She is well-developed.  HENT:     Head: Normocephalic and atraumatic.  Eyes:     Conjunctiva/sclera: Conjunctivae normal.  Cardiovascular:     Rate and Rhythm: Normal rate and regular rhythm.     Heart sounds: Normal heart sounds.   Pulmonary:     Effort: Pulmonary effort is normal.     Breath sounds: Normal breath sounds.  Abdominal:     General: Bowel sounds are normal.     Palpations: Abdomen is soft.  Musculoskeletal:     Cervical back: Normal range of motion.  Lymphadenopathy:     Cervical: No cervical adenopathy.  Skin:    General: Skin is warm and dry.     Capillary Refill: Capillary refill takes less than 2 seconds.  Neurological:     Mental Status: She is alert and oriented to person, place, and time.  Psychiatric:        Behavior: Behavior normal.      Musculoskeletal Exam: C-spine, thoracic spine, lumbar spine have good range of motion.  Shoulder joints, elbow joints, wrist joints, MCPs, PIPs, DIPs have good range of motion with no synovitis.  Complete fist formation bilaterally.  Hip joints have good range of motion with no groin pain.  Knee joints have good range of motion with no warmth or effusion.  Ankle joints have good range of motion with no tenderness or joint swelling.  CDAI Exam: CDAI Score: -- Patient Global: --; Provider Global: -- Swollen: --; Tender: -- Joint Exam 07/27/2023   No joint exam has been documented for this visit   There is currently no information documented on the homunculus. Go to the Rheumatology activity and complete the homunculus joint exam.  Investigation: No additional findings.  Imaging: No results found.  Recent Labs: Lab Results  Component Value Date   WBC 5.4 01/17/2023   HGB 15.4 01/17/2023   PLT 274 01/17/2023   NA 142 01/17/2023   K 4.9 01/17/2023   CL 104 01/17/2023   CO2 28 01/17/2023   GLUCOSE 94 01/17/2023   BUN 13 01/17/2023   CREATININE 0.87 01/17/2023   BILITOT 0.5 01/17/2023   AST 19 01/17/2023   ALT 15 01/17/2023   PROT 6.8 01/17/2023   CALCIUM 9.6 01/17/2023   GFRAA 81 07/07/2020    Speciality Comments: PLQ Eye Exam: 01/10/2023 WNL @ [facility] Follow up 1 year  Procedures:  No procedures performed Allergies:  Levaquin [levofloxacin in d5w] and Sulfa antibiotics    Assessment / Plan:     Visit Diagnoses: Rheumatoid arthritis involving multiple sites with positive rheumatoid factor (HCC) - RF positive, anti-CCP positive: She has no synovitis on examination today.  She has not had any signs or symptoms of a rheumatoid arthritis flare.  She has clinically been doing well taking Plaquenil 200 mg 1 tablet in the morning and half tablet in the evening.  She is tolerating combination therapy without any side effects and has not had any recent gaps in therapy.  She is not taking any over-the-counter products for symptomatic relief.  She has no morning stiffness, nocturnal pain, or difficulty with ADLs.  She will remain on Plaquenil as monotherapy.  She was  advised to notify us if she develops signs or symptoms of a flare.  She will follow-up in the office in 5 months or sooner if needed.- Plan: Lipid panel  Association of heart disease with rheumatoid arthritis was discussed. Need to monitor blood pressure, cholesterol, and to exercise 30-60 minutes on daily basis was discussed.   High risk medication use - Plaquenil 200 mg 1 tablet in the morning and 1/2 tablet in the evening.   PLQ Eye Exam: 01/10/2023 WNL @ [facility]. Follow up 1 year  CBC and CMP updated on 01/17/23. Orders for CBC and CMP released today.  Lipid panel released today. - Plan: COMPLETE METABOLIC PANEL WITH GFR, CBC with Differential/Platelet, Lipid panel  Screening for lipid disorders -Lipid panel updated today.  Plan: Lipid panel  Other medical conditions are listed as follows:  Family history of rheumatoid arthritis  Tenosynovitis of right wrist: Resolved.  No recurrence.  History of kidney stones  Former smoker - quit 06/26/22. Smoked 1/2 PPD X 40 years.    Orders: Orders Placed This Encounter  Procedures   COMPLETE METABOLIC PANEL WITH GFR   CBC with Differential/Platelet   Lipid panel   No orders of the defined  types were placed in this encounter.    Follow-Up Instructions: Return in about 5 months (around 12/27/2023) for Rheumatoid arthritis, Osteoarthritis.   Gearldine Bienenstock, PA-C  Note - This record has been created using Dragon software.  Chart creation errors have been sought, but may not always  have been located. Such creation errors do not reflect on  the standard of medical care.

## 2023-07-14 ENCOUNTER — Ambulatory Visit: Payer: 59 | Admitting: Physician Assistant

## 2023-07-14 DIAGNOSIS — M65931 Unspecified synovitis and tenosynovitis, right forearm: Secondary | ICD-10-CM

## 2023-07-14 DIAGNOSIS — Z8261 Family history of arthritis: Secondary | ICD-10-CM

## 2023-07-14 DIAGNOSIS — Z79899 Other long term (current) drug therapy: Secondary | ICD-10-CM

## 2023-07-14 DIAGNOSIS — Z87891 Personal history of nicotine dependence: Secondary | ICD-10-CM

## 2023-07-14 DIAGNOSIS — M0579 Rheumatoid arthritis with rheumatoid factor of multiple sites without organ or systems involvement: Secondary | ICD-10-CM

## 2023-07-14 DIAGNOSIS — Z720 Tobacco use: Secondary | ICD-10-CM

## 2023-07-14 DIAGNOSIS — Z87442 Personal history of urinary calculi: Secondary | ICD-10-CM

## 2023-07-27 ENCOUNTER — Encounter: Payer: Self-pay | Admitting: Physician Assistant

## 2023-07-27 ENCOUNTER — Ambulatory Visit: Attending: Physician Assistant | Admitting: Physician Assistant

## 2023-07-27 VITALS — BP 144/88 | HR 93 | Resp 16 | Ht 67.5 in | Wt 180.0 lb

## 2023-07-27 DIAGNOSIS — Z79899 Other long term (current) drug therapy: Secondary | ICD-10-CM

## 2023-07-27 DIAGNOSIS — Z87891 Personal history of nicotine dependence: Secondary | ICD-10-CM

## 2023-07-27 DIAGNOSIS — Z8261 Family history of arthritis: Secondary | ICD-10-CM | POA: Diagnosis not present

## 2023-07-27 DIAGNOSIS — Z13220 Encounter for screening for lipoid disorders: Secondary | ICD-10-CM

## 2023-07-27 DIAGNOSIS — M0579 Rheumatoid arthritis with rheumatoid factor of multiple sites without organ or systems involvement: Secondary | ICD-10-CM | POA: Diagnosis not present

## 2023-07-27 DIAGNOSIS — M65931 Unspecified synovitis and tenosynovitis, right forearm: Secondary | ICD-10-CM | POA: Diagnosis not present

## 2023-07-27 DIAGNOSIS — Z87442 Personal history of urinary calculi: Secondary | ICD-10-CM

## 2023-07-28 LAB — COMPREHENSIVE METABOLIC PANEL WITH GFR
AG Ratio: 1.8 (calc) (ref 1.0–2.5)
ALT: 15 U/L (ref 6–29)
AST: 17 U/L (ref 10–35)
Albumin: 4.1 g/dL (ref 3.6–5.1)
Alkaline phosphatase (APISO): 101 U/L (ref 37–153)
BUN: 14 mg/dL (ref 7–25)
CO2: 31 mmol/L (ref 20–32)
Calcium: 9.6 mg/dL (ref 8.6–10.4)
Chloride: 104 mmol/L (ref 98–110)
Creat: 0.83 mg/dL (ref 0.50–1.05)
Globulin: 2.3 g/dL (ref 1.9–3.7)
Glucose, Bld: 98 mg/dL (ref 65–99)
Potassium: 5.1 mmol/L (ref 3.5–5.3)
Sodium: 141 mmol/L (ref 135–146)
Total Bilirubin: 0.5 mg/dL (ref 0.2–1.2)
Total Protein: 6.4 g/dL (ref 6.1–8.1)
eGFR: 79 mL/min/{1.73_m2} (ref >=60)

## 2023-07-28 LAB — CBC WITH DIFFERENTIAL/PLATELET
Absolute Lymphocytes: 1326 {cells}/uL (ref 850–3900)
Absolute Monocytes: 592 {cells}/uL (ref 200–950)
Basophils Absolute: 20 {cells}/uL (ref 0–200)
Basophils Relative: 0.3 %
Eosinophils Absolute: 72 {cells}/uL (ref 15–500)
Eosinophils Relative: 1.1 %
HCT: 41.7 % (ref 35.0–45.0)
Hemoglobin: 13.7 g/dL (ref 11.7–15.5)
MCH: 30.8 pg (ref 27.0–33.0)
MCHC: 32.9 g/dL (ref 32.0–36.0)
MCV: 93.7 fL (ref 80.0–100.0)
MPV: 8.6 fL (ref 7.5–12.5)
Monocytes Relative: 9.1 %
Neutro Abs: 4492 {cells}/uL (ref 1500–7800)
Neutrophils Relative %: 69.1 %
Platelets: 533 10*3/uL — ABNORMAL HIGH (ref 140–400)
RBC: 4.45 10*6/uL (ref 3.80–5.10)
RDW: 12.1 % (ref 11.0–15.0)
Total Lymphocyte: 20.4 %
WBC: 6.5 10*3/uL (ref 3.8–10.8)

## 2023-07-28 LAB — LIPID PANEL
Cholesterol: 217 mg/dL — ABNORMAL HIGH (ref <200)
HDL: 47 mg/dL — ABNORMAL LOW (ref >=50)
LDL Cholesterol (Calc): 147 mg/dL — ABNORMAL HIGH
Non-HDL Cholesterol (Calc): 170 mg/dL — ABNORMAL HIGH (ref <130)
Total CHOL/HDL Ratio: 4.6 (calc) (ref <5.0)
Triglycerides: 111 mg/dL (ref <150)

## 2023-07-28 NOTE — Progress Notes (Signed)
 Platelet count is elevated-533K. Rest of CBC WNL.   CMP WNL.   Total cholesterol elevated-217.  HDL 47.  LDL is elevated 147.  Recommend evaluation by PCP

## 2023-10-03 ENCOUNTER — Other Ambulatory Visit: Payer: Self-pay | Admitting: *Deleted

## 2023-10-03 DIAGNOSIS — M0579 Rheumatoid arthritis with rheumatoid factor of multiple sites without organ or systems involvement: Secondary | ICD-10-CM

## 2023-10-03 MED ORDER — HYDROXYCHLOROQUINE SULFATE 200 MG PO TABS
ORAL_TABLET | ORAL | 0 refills | Status: DC
Start: 2023-10-03 — End: 2023-12-28

## 2023-10-03 NOTE — Telephone Encounter (Signed)
 Last Fill: 06/13/2023  Eye exam: 01/10/2023 WNL   Labs: 07/27/2023 Platelet count is elevated-533K. Rest of CBC WNL.   CMP WNL.   Total cholesterol elevated-217.  HDL 47. LDL is elevated 147.  Recommend evaluation by PCP  Next Visit: 12/28/2023  Last Visit: 07/27/2023  DX: Rheumatoid arthritis involving multiple sites with positive rheumatoid factor   Current Dose per office note 07/27/2023: Plaquenil  200 mg 1 tablet in the morning and 1/2 tablet in the evening   Okay to refill Plaquenil ?

## 2023-12-14 NOTE — Progress Notes (Signed)
 Office Visit Note  Patient: Amber Sampson             Date of Birth: 09/23/1960           MRN: [id number]             PCP: Loreli Kins, MD Referring: Loreli Kins, MD Visit Date: 12/28/2023 Occupation: @GUAROCC @  Subjective:  Recent flare  History of Present Illness: Amber Sampson is a 63 y.o. female with history of seropositive rheumatoid arthritis.  Patient remains on Plaquenil  200 mg 1 tablet in the morning and 1/2 tablet in the evening.  She is tolerating Plaquenil  without any side effects and has not had any recent gaps in therapy.  Patient states that in July while on vacation she had a flare involving both hands.  Patient states that she attributes the flareups to a change in activity.  She takes Tylenol sparingly for symptomatic relief.  She has not had any recurrence of the symptoms.  She denies any recurrent flares. She denies any new medical conditions.  She denies any recent or recurrent infections.   Activities of Daily Living:  Patient reports morning stiffness for 0 minute.   Patient Denies nocturnal pain.  Difficulty dressing/grooming: Denies Difficulty climbing stairs: Denies Difficulty getting out of chair: Denies Difficulty using hands for taps, buttons, cutlery, and/or writing: Denies  Review of Systems  Constitutional:  Negative for fatigue.  HENT:  Negative for mouth sores and mouth dryness.   Eyes:  Negative for dryness.  Respiratory:  Negative for shortness of breath.   Cardiovascular:  Negative for chest pain and palpitations.  Gastrointestinal:  Negative for blood in stool, constipation and diarrhea.  Endocrine: Negative for increased urination.  Genitourinary:  Negative for involuntary urination.  Musculoskeletal:  Negative for joint pain, gait problem, joint pain, joint swelling, myalgias, muscle weakness, morning stiffness, muscle tenderness and myalgias.  Skin:  Negative for color change, rash, hair loss and sensitivity to sunlight.   Allergic/Immunologic: Negative for susceptible to infections.  Neurological:  Positive for headaches. Negative for dizziness.  Hematological:  Negative for swollen glands.  Psychiatric/Behavioral:  Positive for sleep disturbance. Negative for depressed mood. The patient is not nervous/anxious.     PMFS History:  Patient Active Problem List   Diagnosis Date Noted   Rheumatoid arthritis involving multiple sites with positive rheumatoid factor (HCC) 11/09/2017   History of kidney stones 10/03/2017   Family history of rheumatoid arthritis 10/03/2017    Past Medical History:  Diagnosis Date   Carpal tunnel syndrome    Kidney stones    Rheumatoid arthritis (HCC)     Family History  Problem Relation Age of Onset   Heart Problems Mother        pacemaker    Arthritis Father    Dementia Sister    Healthy Daughter    Healthy Daughter    Healthy Son    Past Surgical History:  Procedure Laterality Date   CESAREAN SECTION     CYSTOSCOPY  2018   DILATION AND CURETTAGE OF UTERUS     x6   HERNIA REPAIR     UTERINE SEPTUM RESECTION  1990s   Social History   Social History Narrative   Not on file   Immunization History  Administered Date(s) Administered   Influenza-Unspecified 01/31/2017   PFIZER(Purple Top)SARS-COV-2 Vaccination 07/20/2019, 08/10/2019, 02/22/2020   Pfizer Covid-19 Vaccine Bivalent Booster 66yrs & up 04/20/2021   Pneumococcal Conjugate-13 03/11/2018     Objective: Vital Signs:  BP (!) 142/91 (BP Location: Left Arm, Patient Position: Sitting, Cuff Size: Normal)   Pulse (!) 103   Resp 12   Ht 5' 7.5 (1.715 m)   Wt 178 lb 6.4 oz (80.9 kg)   BMI 27.53 kg/m    Physical Exam Vitals and nursing note reviewed.  Constitutional:      Appearance: She is well-developed.  HENT:     Head: Normocephalic and atraumatic.  Eyes:     Conjunctiva/sclera: Conjunctivae normal.  Cardiovascular:     Rate and Rhythm: Normal rate and regular rhythm.     Heart sounds:  Normal heart sounds.  Pulmonary:     Effort: Pulmonary effort is normal.     Breath sounds: Normal breath sounds.  Abdominal:     General: Bowel sounds are normal.     Palpations: Abdomen is soft.  Musculoskeletal:     Cervical back: Normal range of motion.  Lymphadenopathy:     Cervical: No cervical adenopathy.  Skin:    General: Skin is warm and dry.     Capillary Refill: Capillary refill takes less than 2 seconds.  Neurological:     Mental Status: She is alert and oriented to person, place, and time.  Psychiatric:        Behavior: Behavior normal.      Musculoskeletal Exam: C-spine, thoracic spine, lumbar spine good range of motion.  Shoulder joints, elbow joints, wrist joints, MCPs, PIPs, DIPs have good range of motion with no synovitis.  Complete fist formation bilaterally.  Hip joints have good range of motion with no groin pain.  Knee joints have good range of motion no warmth or effusion.  Ankle joints have good range of motion with no tenderness or joint swelling.  CDAI Exam: CDAI Score: -- Patient Global: --; Provider Global: -- Swollen: --; Tender: -- Joint Exam 12/28/2023   No joint exam has been documented for this visit   There is currently no information documented on the homunculus. Go to the Rheumatology activity and complete the homunculus joint exam.  Investigation: No additional findings.  Imaging: No results found.  Recent Labs: Lab Results  Component Value Date   WBC 6.5 07/27/2023   HGB 13.7 07/27/2023   PLT 533 (H) 07/27/2023   NA 141 07/27/2023   K 5.1 07/27/2023   CL 104 07/27/2023   CO2 31 07/27/2023   GLUCOSE 98 07/27/2023   BUN 14 07/27/2023   CREATININE 0.83 07/27/2023   BILITOT 0.5 07/27/2023   AST 17 07/27/2023   ALT 15 07/27/2023   PROT 6.4 07/27/2023   CALCIUM 9.6 07/27/2023   GFRAA 81 07/07/2020    Speciality Comments: PLQ Eye Exam: 01/10/2023 WNL @ [facility] Follow up 1 year  Procedures:  No procedures  performed Allergies: Levaquin [levofloxacin in d5w] and Sulfa antibiotics   Assessment / Plan:     Visit Diagnoses: Rheumatoid arthritis involving multiple sites with positive rheumatoid factor (HCC) - - RF positive, anti-CCP positive: She has no synovitis on examination today.  Patient had a recent flare involving both hands in July while on vacation.  She attributes the recent flare to an increase in activity.  She has been taking Plaquenil  200 mg 1 tablet in the morning and half tablet in the evening without interruption.  She continues to tolerate Plaquenil  without any side effects.  She takes Tylenol sparingly for symptomatic relief.  She has not had any recurrence of symptoms.  She is not experiencing any morning stiffness, nocturnal  pain, or difficulty performing ADLs.  She will remain on Plaquenil  as monotherapy.  She was advised to notify us  if she develops signs or symptoms of a flare.  She will follow-up in the office in 5 months or sooner if needed.  - Plan: hydroxychloroquine  (PLAQUENIL ) 200 MG tablet  High risk medication use - Plaquenil  200 mg 1 tablet in the morning and 1/2 tablet in the evening. CBC and CMP updated on 07/27/23.  Orders for CBC and CMP released today. Lipid panel updated 07/27/23.  PLQ Eye Exam: 01/10/2023 WNL @ [facility] Follow up 1 year.  She is scheduled for an updated Plaquenil  examination on 01/16/2024.  She was given a new Plaquenil  examination form to take with her to her upcoming appointment.  - Plan: CBC with Differential/Platelet, Comprehensive metabolic panel with GFR  Family history of rheumatoid arthritis  Tenosynovitis of right wrist: Not currently symptomatic.  No tenderness, synovitis, or tenosynovitis noted.  Other medical conditions are listed as follows:  History of kidney stones  Former smoker - quit 06/26/22. Smoked 1/2 PPD X 40 years.     Orders: Orders Placed This Encounter  Procedures   CBC with Differential/Platelet    Comprehensive metabolic panel with GFR   Meds ordered this encounter  Medications   hydroxychloroquine  (PLAQUENIL ) 200 MG tablet    Sig: Take 1 tablet in the morning and a half of tablet in the evening.    Dispense:  135 tablet    Refill:  0     Follow-Up Instructions: Return in about 5 months (around 05/29/2024) for Rheumatoid arthritis.   Waddell CHRISTELLA Craze, PA-C  Note - This record has been created using Dragon software.  Chart creation errors have been sought, but may not always  have been located. Such creation errors do not reflect on  the standard of medical care.

## 2023-12-28 ENCOUNTER — Ambulatory Visit: Attending: Physician Assistant | Admitting: Physician Assistant

## 2023-12-28 ENCOUNTER — Encounter: Payer: Self-pay | Admitting: Physician Assistant

## 2023-12-28 VITALS — BP 142/91 | HR 103 | Resp 12 | Ht 67.5 in | Wt 178.4 lb

## 2023-12-28 DIAGNOSIS — M65931 Unspecified synovitis and tenosynovitis, right forearm: Secondary | ICD-10-CM

## 2023-12-28 DIAGNOSIS — Z87891 Personal history of nicotine dependence: Secondary | ICD-10-CM

## 2023-12-28 DIAGNOSIS — Z79899 Other long term (current) drug therapy: Secondary | ICD-10-CM

## 2023-12-28 DIAGNOSIS — Z720 Tobacco use: Secondary | ICD-10-CM

## 2023-12-28 DIAGNOSIS — Z8261 Family history of arthritis: Secondary | ICD-10-CM

## 2023-12-28 DIAGNOSIS — M0579 Rheumatoid arthritis with rheumatoid factor of multiple sites without organ or systems involvement: Secondary | ICD-10-CM | POA: Diagnosis not present

## 2023-12-28 DIAGNOSIS — Z87442 Personal history of urinary calculi: Secondary | ICD-10-CM

## 2023-12-28 LAB — COMPREHENSIVE METABOLIC PANEL WITH GFR
AG Ratio: 2 (calc) (ref 1.0–2.5)
ALT: 16 U/L (ref 6–29)
AST: 21 U/L (ref 10–35)
Albumin: 4.5 g/dL (ref 3.6–5.1)
Alkaline phosphatase (APISO): 71 U/L (ref 37–153)
BUN: 8 mg/dL (ref 7–25)
CO2: 30 mmol/L (ref 20–32)
Calcium: 9.6 mg/dL (ref 8.6–10.4)
Chloride: 104 mmol/L (ref 98–110)
Creat: 0.91 mg/dL (ref 0.50–1.05)
Globulin: 2.2 g/dL (ref 1.9–3.7)
Glucose, Bld: 90 mg/dL (ref 65–99)
Potassium: 4.6 mmol/L (ref 3.5–5.3)
Sodium: 141 mmol/L (ref 135–146)
Total Bilirubin: 0.7 mg/dL (ref 0.2–1.2)
Total Protein: 6.7 g/dL (ref 6.1–8.1)
eGFR: 70 mL/min/1.73m2 (ref >=60)

## 2023-12-28 LAB — CBC WITH DIFFERENTIAL/PLATELET
Absolute Lymphocytes: 1305 {cells}/uL (ref 850–3900)
Absolute Monocytes: 463 {cells}/uL (ref 200–950)
Basophils Absolute: 10 {cells}/uL (ref 0–200)
Basophils Relative: 0.2 %
Eosinophils Absolute: 52 {cells}/uL (ref 15–500)
Eosinophils Relative: 1 %
HCT: 48 % — ABNORMAL HIGH (ref 35.0–45.0)
Hemoglobin: 15.7 g/dL — ABNORMAL HIGH (ref 11.7–15.5)
MCH: 30.7 pg (ref 27.0–33.0)
MCHC: 32.7 g/dL (ref 32.0–36.0)
MCV: 93.9 fL (ref 80.0–100.0)
MPV: 8.8 fL (ref 7.5–12.5)
Monocytes Relative: 8.9 %
Neutro Abs: 3370 {cells}/uL (ref 1500–7800)
Neutrophils Relative %: 64.8 %
Platelets: 294 Thousand/uL (ref 140–400)
RBC: 5.11 Million/uL — ABNORMAL HIGH (ref 3.80–5.10)
RDW: 12.7 % (ref 11.0–15.0)
Total Lymphocyte: 25.1 %
WBC: 5.2 Thousand/uL (ref 3.8–10.8)

## 2023-12-28 MED ORDER — HYDROXYCHLOROQUINE SULFATE 200 MG PO TABS: ORAL_TABLET | ORAL | 0 refills | Status: DC

## 2023-12-29 ENCOUNTER — Ambulatory Visit: Payer: Self-pay | Admitting: Physician Assistant

## 2023-12-29 NOTE — Progress Notes (Signed)
 CMP WNL RBC count, hgb, and hct are borderline elevated-we will continue to monitor.  Rest of CBC WNL

## 2023-12-31 ENCOUNTER — Other Ambulatory Visit: Payer: Self-pay | Admitting: Physician Assistant

## 2023-12-31 DIAGNOSIS — M0579 Rheumatoid arthritis with rheumatoid factor of multiple sites without organ or systems involvement: Secondary | ICD-10-CM

## 2024-01-16 DIAGNOSIS — H35371 Puckering of macula, right eye: Secondary | ICD-10-CM | POA: Diagnosis not present

## 2024-01-16 DIAGNOSIS — Z79899 Other long term (current) drug therapy: Secondary | ICD-10-CM | POA: Diagnosis not present

## 2024-01-16 DIAGNOSIS — M069 Rheumatoid arthritis, unspecified: Secondary | ICD-10-CM | POA: Diagnosis not present

## 2024-01-16 DIAGNOSIS — H2513 Age-related nuclear cataract, bilateral: Secondary | ICD-10-CM | POA: Diagnosis not present

## 2024-03-07 DIAGNOSIS — K080 Exfoliation of teeth due to systemic causes: Secondary | ICD-10-CM | POA: Diagnosis not present

## 2024-03-31 ENCOUNTER — Other Ambulatory Visit: Payer: Self-pay | Admitting: Physician Assistant

## 2024-03-31 DIAGNOSIS — M0579 Rheumatoid arthritis with rheumatoid factor of multiple sites without organ or systems involvement: Secondary | ICD-10-CM

## 2024-04-02 NOTE — Telephone Encounter (Signed)
 Last Fill: 12/28/2023  Eye exam: 01/16/2024   Labs: 12/28/2023 CMP WNL RBC count, hgb, and hct are borderline elevated-we will continue to monitor.  Rest of CBC WNL  Next Visit: 06/05/2024  Last Visit: 12/28/2023  IK:Myzlfjunpi arthritis involving multiple sites with positive rheumatoid factor   Current Dose per office note on 12/28/2023: Plaquenil  200 mg 1 tablet in the morning and 1/2 tablet in the evening.   Okay to refill Plaquenil ?

## 2024-05-22 NOTE — Progress Notes (Signed)
 "  Office Visit Note  Patient: Amber Sampson             Date of Birth: [birth year]           MRN: [id number]             PCP: Loreli Kins, MD Referring: Loreli Kins, MD Visit Date: 06/05/2024 Occupation: Data Unavailable  Subjective:  Medication management  History of Present Illness: Amber Sampson is a 64 y.o. female with seropositive rheumatoid arthritis.  She returns today after her last visit in August 2025.  Patient reports having a mild flare around Christmas in her right hand which she relates to increased activity.  She states she also had severe flu around Christmas.  She has had no recurrence of symptoms since then.  She denies morning stiffness joint pain or joint swelling.  She is on Plaquenil  200 mg 1 tablet in the morning and half tablet in the evening.  She denies any interruption in the treatment.    Activities of Daily Living:  Patient reports morning stiffness for 0 minutes.   Patient Denies nocturnal pain.  Difficulty dressing/grooming: Denies Difficulty climbing stairs: Denies Difficulty getting out of chair: Denies Difficulty using hands for taps, buttons, cutlery, and/or writing: Denies  Review of Systems  Constitutional:  Negative for fatigue.  HENT:  Negative for mouth sores and mouth dryness.   Eyes:  Negative for dryness.  Respiratory:  Negative for shortness of breath.   Cardiovascular:  Negative for chest pain and palpitations.  Gastrointestinal:  Negative for blood in stool, constipation and diarrhea.  Endocrine: Negative for increased urination.  Genitourinary:  Negative for involuntary urination.  Musculoskeletal:  Negative for joint pain, gait problem, joint pain, joint swelling, myalgias, muscle weakness, morning stiffness, muscle tenderness and myalgias.  Skin:  Negative for color change, rash, hair loss and sensitivity to sunlight.  Allergic/Immunologic: Negative for susceptible to infections.  Neurological:  Positive for headaches.  Negative for dizziness.  Hematological:  Negative for swollen glands.  Psychiatric/Behavioral:  Negative for depressed mood and sleep disturbance. The patient is not nervous/anxious.     PMFS History:  Patient Active Problem List   Diagnosis Date Noted   Former smoker 06/05/2024   Rheumatoid arthritis involving multiple sites with positive rheumatoid factor (HCC) 11/09/2017   History of kidney stones 10/03/2017   Family history of rheumatoid arthritis 10/03/2017    Past Medical History:  Diagnosis Date   Carpal tunnel syndrome    Kidney stones    Rheumatoid arthritis (HCC)     Family History  Problem Relation Age of Onset   Heart Problems Mother        pacemaker    Arthritis Father    Dementia Sister    Healthy Daughter    Healthy Daughter    Healthy Son    Past Surgical History:  Procedure Laterality Date   CESAREAN SECTION     CYSTOSCOPY  2018   DILATION AND CURETTAGE OF UTERUS     x6   HERNIA REPAIR     UTERINE SEPTUM RESECTION  1990s   Social History[1] Social History   Social History Narrative   Not on file     Immunization History  Administered Date(s) Administered   Influenza-Unspecified 01/31/2017   PFIZER(Purple Top)SARS-COV-2 Vaccination 07/20/2019, 08/10/2019, 02/22/2020   Pfizer Covid-19 Vaccine Bivalent Booster 5yrs & up 04/20/2021   Pneumococcal Conjugate-13 03/11/2018     Objective: Vital Signs: BP 138/84   Temp 97.8 F (36.6  C)   Resp 14   Ht 5' 7.5 (1.715 m)   Wt 181 lb (82.1 kg)   BMI 27.93 kg/m    Physical Exam Vitals and nursing note reviewed.  Constitutional:      Appearance: She is well-developed.  HENT:     Head: Normocephalic and atraumatic.  Eyes:     Conjunctiva/sclera: Conjunctivae normal.  Cardiovascular:     Rate and Rhythm: Normal rate and regular rhythm.     Heart sounds: Normal heart sounds.  Pulmonary:     Effort: Pulmonary effort is normal.     Breath sounds: Normal breath sounds.  Abdominal:      General: Bowel sounds are normal.     Palpations: Abdomen is soft.  Musculoskeletal:     Cervical back: Normal range of motion.  Lymphadenopathy:     Cervical: No cervical adenopathy.  Skin:    General: Skin is warm and dry.     Capillary Refill: Capillary refill takes less than 2 seconds.  Neurological:     Mental Status: She is alert and oriented to person, place, and time.  Psychiatric:        Behavior: Behavior normal.      Musculoskeletal Exam: Cervical, thoracic and lumbar spine were in good range of motion.  There was no SI joint tenderness.  Shoulder joints, elbow joints, wrist joints, MCPs, PIPs and DIPs were in good range of motion with no synovitis.  Hypermobility of both elbow joints was noted.  Hip joints and knee joints were in good range of motion without any warmth swelling or effusion.  There was no tenderness over ankles or MTPs.   CDAI Exam: CDAI Score: -- Patient Global: 0 / 100; Provider Global: 0 / 100 Swollen: --; Tender: -- Joint Exam 06/05/2024   No joint exam has been documented for this visit   There is currently no information documented on the homunculus. Go to the Rheumatology activity and complete the homunculus joint exam.  Investigation: No additional findings.  Imaging: No results found.  Recent Labs: Lab Results  Component Value Date   WBC 5.2 12/28/2023   HGB 15.7 (H) 12/28/2023   PLT 294 12/28/2023   NA 141 12/28/2023   K 4.6 12/28/2023   CL 104 12/28/2023   CO2 30 12/28/2023   GLUCOSE 90 12/28/2023   BUN 8 12/28/2023   CREATININE 0.91 12/28/2023   BILITOT 0.7 12/28/2023   AST 21 12/28/2023   ALT 16 12/28/2023   PROT 6.7 12/28/2023   CALCIUM 9.6 12/28/2023   GFRAA 81 07/07/2020    Speciality Comments: PLQ Eye Exam: 01/16/2024 WNL @ [facility] Follow up 1 year  Procedures:  No procedures performed Allergies: Levaquin [levofloxacin in d5w] and Sulfa antibiotics   Assessment / Plan:     Visit Diagnoses:  Rheumatoid arthritis involving multiple sites with positive rheumatoid factor (HCC) - RF positive, anti-CCP positive: Patient reports having a mild flare in her right wrist joint around Christmas due to unusual use of her right hand.  She has not had any more flares since then.  She has been taking Plaquenil  on a regular basis without any interruption.  She denies any joint pain or joint swelling today.  No synovitis was noted on the examination.  High risk medication use - Plaquenil  200 mg 1 tablet in the morning and 1/2 tablet in the evening. PLQ Eye Exam: 01/16/2024 -December 28, 2023 CBC and CMP were normal.  Will obtain labs today.  Annual  eye examination was advised.  Information regarding the immunization was placed in the AVS.  Plan: CBC with Differential/Platelet, Comprehensive metabolic panel with GFR  History of kidney stones-she denies any recurrence.  Osteoporosis screening-she is postmenopausal.  She also has risk factors including history of rheumatoid arthritis and former smoker.  Will schedule DEXA scan.  Need for regular exercise and stretching was emphasized.  Postmenopausal-plan schedule DEXA scan at [facility] location.  Family history of rheumatoid arthritis-father  Former smoker - quit 06/26/22. Smoked 1/2 PPD X 40 years.  Orders: Orders Placed This Encounter  Procedures   DG BONE DENSITY (DXA)   CBC with Differential/Platelet   Comprehensive metabolic panel with GFR   No orders of the defined types were placed in this encounter.   Follow-Up Instructions: Return in about 5 months (around 11/02/2024) for Rheumatoid arthritis.   Maya Nash, MD  Note - This record has been created using Animal nutritionist.  Chart creation errors have been sought, but may not always  have been located. Such creation errors do not reflect on  the standard of medical care.     [1]  Social History Tobacco Use   Smoking status: Former    Current packs/day: 0.00    Average  packs/day: 0.3 packs/day for 40.0 years (10.0 ttl pk-yrs)    Types: Cigarettes    Start date: 06/06/1982    Quit date: 06/06/2022    Years since quitting: 2.0    Passive exposure: Never   Smokeless tobacco: Never  Vaping Use   Vaping status: Never Used  Substance Use Topics   Alcohol use: Yes    Comment: occasionally    Drug use: Never   "

## 2024-06-05 ENCOUNTER — Encounter: Payer: Self-pay | Admitting: Rheumatology

## 2024-06-05 ENCOUNTER — Ambulatory Visit: Admitting: Rheumatology

## 2024-06-05 VITALS — BP 138/84 | Temp 97.8°F | Resp 14 | Ht 67.5 in | Wt 181.0 lb

## 2024-06-05 DIAGNOSIS — Z780 Asymptomatic menopausal state: Secondary | ICD-10-CM

## 2024-06-05 DIAGNOSIS — Z13820 Encounter for screening for osteoporosis: Secondary | ICD-10-CM | POA: Diagnosis not present

## 2024-06-05 DIAGNOSIS — Z87442 Personal history of urinary calculi: Secondary | ICD-10-CM | POA: Diagnosis not present

## 2024-06-05 DIAGNOSIS — Z8261 Family history of arthritis: Secondary | ICD-10-CM

## 2024-06-05 DIAGNOSIS — M65931 Unspecified synovitis and tenosynovitis, right forearm: Secondary | ICD-10-CM

## 2024-06-05 DIAGNOSIS — Z87891 Personal history of nicotine dependence: Secondary | ICD-10-CM | POA: Diagnosis not present

## 2024-06-05 DIAGNOSIS — Z79899 Other long term (current) drug therapy: Secondary | ICD-10-CM | POA: Diagnosis not present

## 2024-06-05 DIAGNOSIS — M0579 Rheumatoid arthritis with rheumatoid factor of multiple sites without organ or systems involvement: Secondary | ICD-10-CM

## 2024-06-05 NOTE — Patient Instructions (Signed)

## 2024-06-06 ENCOUNTER — Ambulatory Visit: Payer: Self-pay | Admitting: Rheumatology

## 2024-06-06 LAB — CBC WITH DIFFERENTIAL/PLATELET
Absolute Lymphocytes: 1464 {cells}/uL (ref 850–3900)
Absolute Monocytes: 654 {cells}/uL (ref 200–950)
Basophils Absolute: 30 {cells}/uL (ref 0–200)
Basophils Relative: 0.5 %
Eosinophils Absolute: 120 {cells}/uL (ref 15–500)
Eosinophils Relative: 2 %
HCT: 45.2 % (ref 35.9–46.0)
Hemoglobin: 15 g/dL (ref 11.7–15.5)
MCH: 30.8 pg (ref 27.0–33.0)
MCHC: 33.2 g/dL (ref 31.6–35.4)
MCV: 92.8 fL (ref 81.4–101.7)
MPV: 8.9 fL (ref 7.5–12.5)
Monocytes Relative: 10.9 %
Neutro Abs: 3732 {cells}/uL (ref 1500–7800)
Neutrophils Relative %: 62.2 %
Platelets: 251 10*3/uL (ref 140–400)
RBC: 4.87 Million/uL (ref 3.80–5.10)
RDW: 12.5 % (ref 11.0–15.0)
Total Lymphocyte: 24.4 %
WBC: 6 10*3/uL (ref 3.8–10.8)

## 2024-06-06 LAB — COMPREHENSIVE METABOLIC PANEL WITH GFR
AG Ratio: 2 (calc) (ref 1.0–2.5)
ALT: 17 U/L (ref 6–29)
AST: 21 U/L (ref 10–35)
Albumin: 4.5 g/dL (ref 3.6–5.1)
Alkaline phosphatase (APISO): 72 U/L (ref 37–153)
BUN: 11 mg/dL (ref 7–25)
CO2: 30 mmol/L (ref 20–32)
Calcium: 9.6 mg/dL (ref 8.6–10.4)
Chloride: 104 mmol/L (ref 98–110)
Creat: 0.93 mg/dL (ref 0.50–1.05)
Globulin: 2.2 g/dL (ref 1.9–3.7)
Glucose, Bld: 88 mg/dL (ref 65–99)
Potassium: 4.7 mmol/L (ref 3.5–5.3)
Sodium: 141 mmol/L (ref 135–146)
Total Bilirubin: 0.7 mg/dL (ref 0.2–1.2)
Total Protein: 6.7 g/dL (ref 6.1–8.1)
eGFR: 68 mL/min/{1.73_m2}

## 2024-06-30 ENCOUNTER — Other Ambulatory Visit (HOSPITAL_BASED_OUTPATIENT_CLINIC_OR_DEPARTMENT_OTHER)

## 2024-11-06 ENCOUNTER — Ambulatory Visit: Admitting: Physician Assistant
